# Patient Record
Sex: Female | Born: 1980 | Race: White | Hispanic: No | Marital: Married | Smoking: Never smoker
Health system: Southern US, Community
[De-identification: ages and names within clinical notes are randomized; demographics above are authoritative.]

## PROBLEM LIST (undated history)

## (undated) DIAGNOSIS — E890 Postprocedural hypothyroidism: Secondary | ICD-10-CM

## (undated) DIAGNOSIS — F32A Depression, unspecified: Secondary | ICD-10-CM

## (undated) DIAGNOSIS — F419 Anxiety disorder, unspecified: Secondary | ICD-10-CM

---

## 2002-07-21 HISTORY — PX: WISDOM TOOTH EXTRACTION: SHX21

## 2013-06-09 ENCOUNTER — Encounter: Payer: Self-pay | Admitting: *Deleted

## 2013-06-27 ENCOUNTER — Ambulatory Visit (INDEPENDENT_AMBULATORY_CARE_PROVIDER_SITE_OTHER): Payer: BC Managed Care – PPO | Admitting: General Surgery

## 2013-06-27 ENCOUNTER — Encounter: Payer: Self-pay | Admitting: General Surgery

## 2013-06-27 ENCOUNTER — Other Ambulatory Visit: Payer: BC Managed Care – PPO

## 2013-06-27 VITALS — BP 122/78 | HR 74 | Resp 12 | Ht 63.0 in | Wt 117.0 lb

## 2013-06-27 DIAGNOSIS — E079 Disorder of thyroid, unspecified: Secondary | ICD-10-CM | POA: Insufficient documentation

## 2013-06-27 NOTE — Progress Notes (Signed)
Patient ID: Matricia Cannon, female   DOB: [birth year], 32 y.o.   MRN: [id number]  Chief Complaint  Patient presents with  . Other    cervial lymphadenopathy    HPI Latasha Cannon is a 32 y.o. female here today for a evaluation of cervial lymphadenopathy.Patient states she noticed a lump left side of her neck about two months ago. She states the lump is getting bigger but does not hurt. No difficulty with breathing or swallowing. HPI  History reviewed. No pertinent past medical history.  Past Surgical History  Procedure Laterality Date  . Wisdom tooth extraction  2004    History reviewed. No pertinent family history.  Social History History  Substance Use Topics  . Smoking status: Never Smoker   . Smokeless tobacco: Never Used  . Alcohol Use: No    Allergies  Allergen Reactions  . Biaxin [Clarithromycin]     Vision problems   . Amoxicillin Rash  . Ceclor [Cefaclor] Rash  . Codeine Rash    Current Outpatient Prescriptions  Medication Sig Dispense Refill  . cholecalciferol (VITAMIN D) 1000 UNITS tablet Take 1,000 Units by mouth daily.      Marland Kitchen loratadine (CLARITIN) 10 MG tablet Take 10 mg by mouth daily.      Marland Kitchen MICROGESTIN 1-20 MG-MCG tablet Take 1 tablet by mouth daily.      . naproxen sodium (ANAPROX) 220 MG tablet Take 220 mg by mouth as needed.       No current facility-administered medications for this visit.    Review of Systems Review of Systems  Constitutional: Negative.   Respiratory: Negative.     Blood pressure 122/78, pulse 74, resp. rate 12, height 5\' 3"  (1.6 m), weight 117 lb (53.071 kg), last menstrual period 06/09/2013.  Physical Exam Physical Exam  Constitutional: She is oriented to person, place, and time. She appears well-developed and well-nourished.  Eyes: No scleral icterus.  Neck: Mass ( left lobe thryoid upper pole 1cm mass palpable  It is soft, moves with swallowing) present.    Cardiovascular: Normal rate, regular rhythm and normal heart  sounds.   Pulmonary/Chest: Breath sounds normal.  Lymphadenopathy:    She has no cervical adenopathy.    She has no axillary adenopathy.  Neurological: She is alert and oriented to person, place, and time.  Skin: Skin is warm and dry.    Data Reviewed   Assessment   Performed left thyroid ultrasound. There is a 2cm long, 1.3 cm wide solid mas near upper pole of left thyroid lobe.  Will check T4,TSH ;levels and plan for FNA. Pt advised fully and agreeable with the plan     Plan   Patient to have TSH and T4 labs done at [facility] . Patient to return for a FNA        Latasha Cannon G 06/27/2013, 2:36 PM

## 2013-06-28 LAB — T4: T4, Total: 11.6 ug/dL (ref 4.5–12.0)

## 2013-06-28 LAB — TSH: TSH: 2.95 u[IU]/mL (ref 0.450–4.500)

## 2013-06-29 ENCOUNTER — Encounter: Payer: Self-pay | Admitting: *Deleted

## 2013-06-29 ENCOUNTER — Ambulatory Visit (INDEPENDENT_AMBULATORY_CARE_PROVIDER_SITE_OTHER): Payer: BC Managed Care – PPO | Admitting: General Surgery

## 2013-06-29 ENCOUNTER — Other Ambulatory Visit: Payer: BC Managed Care – PPO

## 2013-06-29 ENCOUNTER — Encounter: Payer: Self-pay | Admitting: General Surgery

## 2013-06-29 VITALS — BP 140/80 | HR 82 | Resp 14 | Ht 63.0 in | Wt 116.0 lb

## 2013-06-29 DIAGNOSIS — E079 Disorder of thyroid, unspecified: Secondary | ICD-10-CM

## 2013-06-29 NOTE — Patient Instructions (Signed)
Keep area dry and clean.

## 2013-06-29 NOTE — Progress Notes (Signed)
Needle aspiration thyroid nodule done today, cytology sent. Labs-T4 11.6, TSH 2.90

## 2013-06-29 NOTE — Addendum Note (Signed)
Addended by: Volney Presser on: 06/29/2013 04:35 PM   Modules accepted: Orders

## 2013-07-01 LAB — FINE-NEEDLE ASPIRATION

## 2013-07-05 ENCOUNTER — Telehealth: Payer: Self-pay | Admitting: *Deleted

## 2013-07-05 NOTE — Telephone Encounter (Signed)
Patient has been scheduled for office visit follow for 09-07-13. She is aware of all instructions and verbalizes understanding.

## 2013-07-05 NOTE — Telephone Encounter (Signed)
Message copied by Nicholes Mango on Tue Jul 05, 2013  9:21 AM ------      Message from: Kieth Brightly      Created: Tue Jul 05, 2013  9:05 AM       Pt advised of report. Feel it is ok to observe for now. Need to recheck in 2 mos with exam and [place].      Pt advised to call if she has any pain, trouble swallowing or other symptoms in the interval. ------

## 2013-07-21 DIAGNOSIS — C801 Malignant (primary) neoplasm, unspecified: Secondary | ICD-10-CM

## 2013-07-21 HISTORY — PX: THYROIDECTOMY: SHX17

## 2013-07-21 HISTORY — DX: Malignant (primary) neoplasm, unspecified: C80.1

## 2013-08-15 ENCOUNTER — Ambulatory Visit: Payer: Self-pay | Admitting: Otolaryngology

## 2013-08-15 LAB — ALBUMIN: Albumin: 3.8 g/dL (ref 3.4–5.0)

## 2013-08-15 LAB — CALCIUM
CALCIUM: 8.7 mg/dL (ref 8.5–10.1)
CALCIUM: 9.1 mg/dL (ref 8.5–10.1)

## 2013-08-18 LAB — PATHOLOGY REPORT

## 2013-09-07 ENCOUNTER — Ambulatory Visit: Payer: BC Managed Care – PPO | Admitting: General Surgery

## 2013-09-20 ENCOUNTER — Ambulatory Visit: Payer: Self-pay

## 2014-05-22 ENCOUNTER — Encounter: Payer: Self-pay | Admitting: General Surgery

## 2014-07-05 DIAGNOSIS — K1120 Sialoadenitis, unspecified: Secondary | ICD-10-CM | POA: Insufficient documentation

## 2014-10-02 ENCOUNTER — Ambulatory Visit: Payer: Self-pay | Admitting: Internal Medicine

## 2014-11-11 NOTE — Op Note (Signed)
PATIENT NAME:  Latasha Cannon, Latasha Cannon MR#:  [id number] DATE OF BIRTH:  01-18-1981  DATE OF PROCEDURE:  08/15/2013  PREOPERATIVE DIAGNOSIS: Papillary thyroid carcinoma.   POSTOPERATIVE DIAGNOSIS: Papillary thyroid carcinoma.  PROCEDURE PERFORMED: Total thyroidectomy with laryngeal nerve monitoring.   SURGEON: Carloyn Manner, MD.  ANESTHESIA: General endotracheal anesthesia.   ESTIMATED BLOOD LOSS: Less than 25 mL.   IV FLUIDS: Please see anesthesia record.   COMPLICATIONS: None.   DRAINS/STENT PLACEMENTS: None.   SPECIMENS: Total thyroid marked superior pole of the left thyroid lobe.   ASSISTANT: Beverly Gust, MD.  INDICATIONS FOR PROCEDURE: The patient is a 34 year old female with history of a left thyroid nodule found on FNA to be consistent with papillary thyroid carcinoma. A neck ultrasound did not reveal any pathologic-appearing lymphadenopathy.   OPERATIVE FINDINGS: Bilateral recurrent laryngeal nerves were identified and preserved. Bilateral superior and inferior parathyroid glands were identified and preserved. There was a tumor with fibrotic scarring to the laryngeal musculature that was peeled off of the tracheal wall.   DESCRIPTION OF PROCEDURE: The patient was identified in holding, benefits and risks of the procedure were discussed and the patient was marked. The patient was taken to the operating  room, prepped and draped in a sterile fashion after insertion of a laryngeal nerve monitoring endotracheal tube. Visualization was ensured that the electrodes were at the level of the vocal cords. At this time, 3.5 mL of 0.25% plain Marcaine with 1:200,000 epinephrine were injected into the anterior neck and A 15 blade scalpel was used to make an incision along the previously marked anterior neck crease approximately 2 fingerbreadths above the sternal notch. Dissection was carried down through the level of the platysma with Bovie electrocautery. The strap muscles were encountered.  These were divided along the median raphe. At this time, the sternohyoid and sternothyroid muscles were noted to be fibrotically scarred down to the isthmus and the left superior pole, most likely from prior FNAs. At this point, the sternohyoid and sternothyroid muscles were separated from the left thyroid and care was taken to avoid entering the thyroid parenchyma itself. The lateral border of the thyroid and the great vessels were identified and then blunt dissection was carried superiorly and inferiorly. Superiorly, the superior pole was isolated medially and then laterally and this was pedicled and then the superior pole was taken with Bovie electrocautery with a Harmonic scalpel.   At this time, the left hemi-thyroid was retracted medially and using Terris elevators and retractors, the recurrent laryngeal nerve was identified. The superior and inferior parathyroid were also identified at this time and these were protected and left within the neck. The recurrent laryngeal nerve was traced until its insertion into the patient's larynx and then the remaining attachments of the left hemi-thyroid were divided from the trachea. There was fibrotic scarring at the midline of the isthmus and near the area of the nodule. This was peeled off the trachea with a Harmonic scalpel and bipolar forceps. At this time, a pyramidal lobe was encountered [place] of the isthmus. This was again fibrotically scarring to the underlying musculature. This was traced until its uperior most aspect and this was divided using a Harmonic scalpel. The remaining attachments of the isthmus were separated from the anterior tracheal wall. At this time, attention was directed to the patient's right hemi-thyroid and the sternothyroid and sternohyoid muscles were separated from the anterior border of the right hemi-thyroid using blunt techniques. Dissection was carried inferiorly and superiorly along the lateral border of  the thyroid until the carotid  was encountered. A plane between the larynx and the superior thyroid lobe was opened and the right superior thyroid lobe was pedicled and this was divided using a Harmonic scalpel. At this time, the hemi-thyroid was retracted medially. The superior parathyroid gland as well as an inferior parathyroid gland were identified and these were left down and not manipulated. Dissection just lateral to the trachea revealed the recurrent laryngeal nerve. This was robustly stimulated. The nerve was traced until its insertion in the larynx and then Berry's ligament was divided.   The remaining attachments along the inferior pole of the right hemi-thyroid were divided and then the entire specimen was passed off the table and a stitch was placed in the left superior pole. At this time, evaluation of the patient's thyroid wound bed revealed meticulous hemostasis. The wound was copiously irrigated with sterile saline. Meticulous hemostasis was ensured. All 4 parathyroid glands were identified as well as the recurrent laryngeal nerves were stimulated. Surgicel was placed along the thyroid wound bed, along Berry's ligament. The straps were closed in a single interrupted stitch of 4-0 Vicryl in a figure-of-eight fashion and the skin was reapproximated with subdermal Vicryl and the skin was closed with Dermabond and topped with a Steri-Strip. At this time, care of the patient was transferred to anesthesia.    ____________________________ Jerene Bears, MD ccv:aw D: 08/15/2013 09:38:20 ET T: 08/15/2013 10:17:24 ET JOB#: [id number]  cc: Jerene Bears, MD, <Dictator> Jerene Bears MD ELECTRONICALLY SIGNED 08/15/2013 17:40

## 2015-01-31 DIAGNOSIS — D229 Melanocytic nevi, unspecified: Secondary | ICD-10-CM

## 2015-01-31 HISTORY — DX: Melanocytic nevi, unspecified: D22.9

## 2015-02-21 DIAGNOSIS — F4320 Adjustment disorder, unspecified: Secondary | ICD-10-CM | POA: Insufficient documentation

## 2015-02-21 DIAGNOSIS — J309 Allergic rhinitis, unspecified: Secondary | ICD-10-CM | POA: Insufficient documentation

## 2015-02-22 ENCOUNTER — Ambulatory Visit (INDEPENDENT_AMBULATORY_CARE_PROVIDER_SITE_OTHER): Payer: BC Managed Care – PPO | Admitting: Family Medicine

## 2015-02-22 ENCOUNTER — Encounter: Payer: Self-pay | Admitting: Family Medicine

## 2015-02-22 VITALS — BP 112/78 | HR 98 | Temp 98.4°F | Resp 16 | Wt 117.0 lb

## 2015-02-22 DIAGNOSIS — C73 Malignant neoplasm of thyroid gland: Secondary | ICD-10-CM | POA: Diagnosis not present

## 2015-02-22 DIAGNOSIS — F419 Anxiety disorder, unspecified: Secondary | ICD-10-CM

## 2015-02-22 DIAGNOSIS — N941 Unspecified dyspareunia: Secondary | ICD-10-CM

## 2015-02-22 DIAGNOSIS — Z23 Encounter for immunization: Secondary | ICD-10-CM

## 2015-02-22 MED ORDER — ESCITALOPRAM OXALATE 5 MG PO TABS: 5.0000 mg | ORAL_TABLET | Freq: Every day | ORAL | Status: DC

## 2015-02-22 MED ORDER — ALPRAZOLAM 0.5 MG PO TBDP: 0.5000 mg | ORAL_TABLET | Freq: Two times a day (BID) | ORAL | Status: DC | PRN

## 2015-02-22 NOTE — Progress Notes (Deleted)
   Subjective:    Patient ID: Latasha Cannon, female    DOB: 1981-03-06, 34 y.o.   MRN: [id number]  Anxiety        Review of Systems     Objective:   Physical Exam        Assessment & Plan:

## 2015-02-22 NOTE — Progress Notes (Signed)
Patient ID: Latasha Cannon, female   DOB: 12-25-1980, 34 y.o.   MRN: [id number]    Subjective:  HPI Pt is here today to update Dr. Venia Cannon on what she has been being treated for. She reports that she had Stage 3 Papillary Thyroid carcinoma. In Jan 2015 they did a thyroidectomy and in March she had Radioactive Iodine Treatment. She reports that she had a scan done recently and she is cancer free, however the tumor marker was elevated. She also would like to discuss getting on another anxiety medication. She was taking Buspirone but it made her have tremors. She also says that she is sure she is long over due for a Tetanus vaccine and would like to get that today if possible.  Still having problems with dyspareunia. Is working with vaginal dilators and helping.    Prior to Admission medications   Medication Sig Start Date End Date Taking? Authorizing Provider  Cholecalciferol (VITAMIN D-1000 MAX ST) 1000 UNITS tablet Take by mouth.   Yes Historical Provider, MD  levothyroxine (SYNTHROID, LEVOTHROID) 88 MCG tablet Take by mouth. 07/04/14  Yes Historical Provider, MD  loratadine (CLARITIN) 10 MG tablet Take 10 mg by mouth.   Yes Historical Provider, MD  MICROGESTIN 1-20 MG-MCG tablet Take 1 tablet by mouth daily. 05/08/13  Yes Historical Provider, MD  naproxen sodium (ANAPROX) 220 MG tablet Take 220 mg by mouth as needed.   Yes Historical Provider, MD    Patient Active Problem List   Diagnosis Date Noted  . Papillary thyroid carcinoma 02/22/2015  . Anxiety 02/22/2015  . Allergic rhinitis 02/21/2015  . 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 Adenitis, salivary, recurring 07/05/2014  . Thyroid mass 06/27/2013  . Lump in thyroid 06/27/2013    History reviewed. No pertinent past medical history.  History   Social History  . Marital Status: Married    Spouse Name: N/A  . Number of Children: N/A  . Years of Education: College   Occupational History  . Not on file.   Social History Main Topics   . Smoking status: Never Smoker   . Smokeless tobacco: Never Used  . Alcohol Use: No  . Drug Use: No  . Sexual Activity: Not on file   Other Topics Concern  . Not on file   Social History Narrative    Allergies  Allergen Reactions  . Biaxin [Clarithromycin]     Vision problems   . Buspirone Other (See Comments)    tremors  . Prednisone   . Amoxicillin Rash  . Ceclor [Cefaclor] Rash  . Codeine Rash    Review of Systems  Constitutional: Negative.   HENT: Negative.   Eyes: Negative.   Respiratory: Negative.   Cardiovascular: Negative.   Gastrointestinal: Negative.   Genitourinary: Negative.   Musculoskeletal: Negative.   Skin: Negative.   Neurological: Negative.   Endo/Heme/Allergies: Negative.   Psychiatric/Behavioral: The patient is nervous/anxious.      There is no immunization history on file for this patient. Objective:  BP 112/78 mmHg  Pulse 98  Temp(Src) 98.4 F (36.9 C) (Oral)  Resp 16  Wt 117 lb (53.071 kg)  Physical Exam  Constitutional: She is oriented to person, place, and time and well-developed, well-nourished, and in no distress.  Neurological: She is oriented to person, place, and time.  Psychiatric: Mood, memory, affect and judgment normal.       Lab Results  Component Value Date   TSH 2.950 06/27/2013    CMP  Component Value Date/Time   CALCIUM 9.1 08/15/2013 1603   ALBUMIN 3.8 08/15/2013 1005    Assessment and Plan :  1. Papillary thyroid carcinoma In remission.  Is followed by endocrinologist, Dr. Gabriel Cannon.  Taking Levothyroxine.   2. Anxiety Worsening.  Will start very low dose Lexapro 2.5 mg daily. Recheck in 6 weeks.  Very sensitive to medication.    3. Need for diphtheria-tetanus-pertussis (Tdap) vaccine, adult/adolescent Given today.  - Tdap vaccine greater than or equal to 7yo IM  4. Dyspareunia, female Continue to work with dilators with hopes of intercourse. Does want to have children.     [facility] 02/22/2015 11:14 AM

## 2015-03-05 ENCOUNTER — Encounter: Payer: Self-pay | Admitting: Family Medicine

## 2015-03-09 ENCOUNTER — Telehealth: Payer: Self-pay | Admitting: Family Medicine

## 2015-03-09 NOTE — Telephone Encounter (Signed)
Patient called at 3:30 pm reporting that she has noticed that the pupil in her left eye looks dilated. Patient wanted to know if Lexapro could be causing her pupil to dilate. Patient reports that she started taking medication 2 weeks ago. Patient denies redness, discharge or any injury to her eye. Patient reports that she does have problems with seasonal allergies. Patient reports that she has not tried any OTC medication to help with her symptoms. I advised pt to be seen today, pt reports that she could not come in today. I advised pt to call or go to ER if symptoms persist or worsen over night or over the weekend. Patient's call back number is [phone]. sd

## 2015-03-13 ENCOUNTER — Ambulatory Visit (INDEPENDENT_AMBULATORY_CARE_PROVIDER_SITE_OTHER): Payer: BC Managed Care – PPO | Admitting: Family Medicine

## 2015-03-13 ENCOUNTER — Encounter: Payer: Self-pay | Admitting: Family Medicine

## 2015-03-13 VITALS — BP 114/84 | HR 72 | Temp 98.2°F | Resp 14 | Ht 64.0 in | Wt 117.2 lb

## 2015-03-13 DIAGNOSIS — J302 Other seasonal allergic rhinitis: Secondary | ICD-10-CM | POA: Diagnosis not present

## 2015-03-13 DIAGNOSIS — H5702 Anisocoria: Secondary | ICD-10-CM

## 2015-03-13 MED ORDER — FLUTICASONE PROPIONATE 50 MCG/ACT NA SUSP: 2.0000 | Freq: Every day | NASAL | Status: DC

## 2015-03-13 NOTE — Patient Instructions (Signed)
Please f/u with eye doctor.

## 2015-03-13 NOTE — Progress Notes (Signed)
Subjective:     Patient ID: Latasha Cannon, female   DOB: 10/13/1980, 34 y.o.   MRN: [id number]  HPI  Chief Complaint  Patient presents with  . Eye Problem    Patient comes in office today with concerns of dilation of her left eye. Patient states that peers and coworkers have noticed change in left eye and she believes it might be due to new medication she started two weeks ago called Escitalopram. Patient states that when she read side effects on medication it stated that it can cause eye problems swelling and itching. Patient states that she does have a history of seasonal allergies and is unsure if it could be related or not to symptoms.   She states she and her co-workers noticed her left pupil was larger than her right about a week ago. No change in vision reported but she has developed increased allergy sx in the last few days with itchy watery eyes. This has not improved with the use of Zaditor for the last two days. Hx of papillary thyroid cancer with surgery and RAI treatments a year ago.   Review of Systems  HENT:       Denies eye injury or prior hx of anisocoria       Objective:   Physical Exam  Constitutional: She appears well-developed and well-nourished. No distress.  Left pupil slightly larger than right. Both reactive in ambient light and in the dark. EOM's intact     Assessment:    Anisocoria - Plan: Ambulatory referral to Ophthalmology, CANCELED: Ambulatory referral to Ophthalmology  Other seasonal allergic rhinitis - Plan: fluticasone (FLONASE) 50 MCG/ACT nasal spray     Plan:    Further f/u pending ophthalmology evaluation.

## 2015-03-14 ENCOUNTER — Telehealth: Payer: Self-pay | Admitting: Family Medicine

## 2015-03-14 NOTE — Telephone Encounter (Signed)
Pt states she seen Mikki Santee and was referred to [facility].  The doctor at [facility] has told pt that the Rx escitalopram (LEXAPRO) 5 MG could be the cause of her eye issues.  Pt is asking can she just stop taking this Rx or does she need to slowly stop taking this Rx to see if this will help with the eye issue?  [id number]#[phone]/TD

## 2015-03-14 NOTE — Telephone Encounter (Signed)
Ok to stop.  Will cut in 1/2 for couple of days and stop.  Will take as needed medication and call me back. Thanks.

## 2015-04-04 ENCOUNTER — Ambulatory Visit (INDEPENDENT_AMBULATORY_CARE_PROVIDER_SITE_OTHER): Payer: BC Managed Care – PPO | Admitting: Family Medicine

## 2015-04-04 ENCOUNTER — Encounter: Payer: Self-pay | Admitting: Family Medicine

## 2015-04-04 VITALS — BP 116/74 | HR 96 | Temp 99.0°F | Resp 16 | Wt 119.0 lb

## 2015-04-04 DIAGNOSIS — J302 Other seasonal allergic rhinitis: Secondary | ICD-10-CM

## 2015-04-04 DIAGNOSIS — F419 Anxiety disorder, unspecified: Secondary | ICD-10-CM

## 2015-04-04 DIAGNOSIS — N941 Unspecified dyspareunia: Secondary | ICD-10-CM

## 2015-04-04 DIAGNOSIS — H6980 Other specified disorders of Eustachian tube, unspecified ear: Secondary | ICD-10-CM | POA: Diagnosis not present

## 2015-04-04 NOTE — Progress Notes (Signed)
Subjective:    Patient ID: Latasha Cannon, female    DOB: 28-May-1981, 34 y.o.   MRN: [id number]  Anxiety Presents for follow-up visit. Symptoms include depressed mood, excessive worry, irritability, nervous/anxious behavior and palpitations. Patient reports no chest pain, compulsions, confusion, decreased concentration, dizziness, dry mouth, feeling of choking, hyperventilation, insomnia, malaise, muscle tension, nausea, obsessions, panic, restlessness, shortness of breath or suicidal ideas. The severity of symptoms is mild. The quality of sleep is good.   Prior compliance problems include medication issues (Pt could not take Escitalopram due to eye problems.).  Otalgia  There is pain in both ears. The current episode started 1 to 4 weeks ago. The problem has been waxing and waning. There has been no fever. The pain is at a severity of 3/10. The pain is mild. Associated symptoms include headaches (sinus). Pertinent negatives include no abdominal pain, coughing, diarrhea, ear discharge, hearing loss, neck pain, rash, rhinorrhea, sore throat or vomiting. She has tried NSAIDs for the symptoms. The treatment provided moderate relief. There is no history of a chronic ear infection, hearing loss or a tympanostomy tube.   Has been taking nasal spray. Not helping.     Review of Systems  Constitutional: Positive for irritability.  HENT: Positive for ear pain. Negative for ear discharge, hearing loss, rhinorrhea and sore throat.   Respiratory: Negative for cough and shortness of breath.   Cardiovascular: Positive for palpitations. Negative for chest pain.  Gastrointestinal: Negative for nausea, vomiting, abdominal pain and diarrhea.  Musculoskeletal: Negative for neck pain.  Skin: Negative for rash.  Neurological: Positive for headaches (sinus). Negative for dizziness.  Psychiatric/Behavioral: Negative for suicidal ideas, confusion and decreased concentration. The patient is nervous/anxious. The  patient does not have insomnia.    BP 116/74 mmHg  Pulse 96  Temp(Src) 99 F (37.2 C) (Oral)  Resp 16  Wt 119 lb (53.978 kg)  LMP 03/21/2015 (Within Days)   Patient Active Problem List   Diagnosis Date Noted  . Papillary thyroid carcinoma 02/22/2015  . Anxiety 02/22/2015  . Dyspareunia, female 02/22/2015  . Allergic rhinitis 02/21/2015  . 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 Adenitis, salivary, recurring 07/05/2014  . Thyroid mass 06/27/2013   No past medical history on file. Current Outpatient Prescriptions on File Prior to Visit  Medication Sig  . ALPRAZolam (NIRAVAM) 0.5 MG dissolvable tablet Take 1 tablet (0.5 mg total) by mouth 2 (two) times daily as needed for anxiety.  . Cholecalciferol (VITAMIN D-1000 MAX ST) 1000 UNITS tablet Take by mouth.  . fluticasone (FLONASE) 50 MCG/ACT nasal spray Place 2 sprays into both nostrils daily.  Marland Kitchen levothyroxine (SYNTHROID, LEVOTHROID) 88 MCG tablet Take by mouth.  . loratadine (CLARITIN) 10 MG tablet Take 10 mg by mouth.  Marland Kitchen MICROGESTIN 1-20 MG-MCG tablet Take 1 tablet by mouth daily.  . naproxen sodium (ANAPROX) 220 MG tablet Take 220 mg by mouth as needed.   No current facility-administered medications on file prior to visit.   Allergies  Allergen Reactions  . Biaxin [Clarithromycin]     Vision problems   . Buspirone Other (See Comments)    tremors  . Escitalopram     Eye Problems  . Prednisone   . Amoxicillin Rash  . Ceclor [Cefaclor] Rash  . Codeine Rash   Past Surgical History  Procedure Laterality Date  . Wisdom tooth extraction  2004  . Thyroidectomy  07/2013    due to thyroid cancer   Social History   Social History  .  Marital Status: Married    Spouse Name: N/A  . Number of Children: N/A  . Years of Education: College   Occupational History  . Not on file.   Social History Main Topics  . Smoking status: Never Smoker   . Smokeless tobacco: Never Used  . Alcohol Use: No  . Drug Use: No  . Sexual  Activity: Not on file   Other Topics Concern  . Not on file   Social History Narrative   Family History  Problem Relation Age of Onset  . Anxiety disorder Mother   . Stroke Father late 44's  . Healthy Brother        Objective:   Physical Exam  Constitutional: She is oriented to person, place, and time. She appears well-developed and well-nourished.  HENT:  Head: Normocephalic and atraumatic.  Right Ear: External ear normal.  Left Ear: External ear normal.  Mouth/Throat: Oropharynx is clear and moist.  Eyes: Conjunctivae and EOM are normal. Pupils are equal, round, and reactive to light.  Neck: Normal range of motion. Neck supple.  Cardiovascular: Normal rate and regular rhythm.   Pulmonary/Chest: Effort normal and breath sounds normal.  Neurological: She is alert and oriented to person, place, and time.  Psychiatric: She has a normal mood and affect. Her behavior is normal. Judgment and thought content normal.   BP 116/74 mmHg  Pulse 96  Temp(Src) 99 F (37.2 C) (Oral)  Resp 16  Wt 119 lb (53.978 kg)  LMP 03/21/2015 (Within Days)         Assessment & Plan:  1. Eustachian tube dysfunction, unspecified laterality Start using  Flonase daily. If does not improve, will refer to ENT.   2. Other seasonal allergic rhinitis Stable.   3. Anxiety Did not tolerate Lexapro, caused eye issues. Continue Alprazolam as needed. Consider Sertraline if worsens. Patient will call.    4. Dyspareunia, female Continue with dilators. Use Xanax and see if helps.   Patient was seen and examined by Jerrell Belfast, MD, and note scribed, in part,  by Renaldo Fiddler, CMA and reviewed with patient. I have reviewed the document for accuracy and completeness and I agree with above. Jerrell Belfast, MD   Margarita Rana, MD

## 2015-05-02 ENCOUNTER — Other Ambulatory Visit: Payer: Self-pay | Admitting: Internal Medicine

## 2015-05-02 DIAGNOSIS — C73 Malignant neoplasm of thyroid gland: Secondary | ICD-10-CM

## 2015-05-28 ENCOUNTER — Encounter: Payer: Self-pay | Admitting: Family Medicine

## 2015-05-28 ENCOUNTER — Ambulatory Visit (INDEPENDENT_AMBULATORY_CARE_PROVIDER_SITE_OTHER): Payer: BC Managed Care – PPO | Admitting: Family Medicine

## 2015-05-28 VITALS — BP 138/90 | HR 105 | Temp 98.0°F | Resp 16 | Ht 64.0 in | Wt 124.6 lb

## 2015-05-28 DIAGNOSIS — J069 Acute upper respiratory infection, unspecified: Secondary | ICD-10-CM | POA: Diagnosis not present

## 2015-05-28 NOTE — Progress Notes (Signed)
Subjective:     Patient ID: Latasha Cannon, female   DOB: 10-Feb-1981, 34 y.o.   MRN: [id number]  HPI Chief Complaint  Patient presents with  . Cough    Patient comes in office today with concerns of cough and congestion x 1 week. Patient reports that symptoms began inital as a sore throat and post nasal drip. Today patient reports that she has watery eyes and her voice is going hoarse, patient states that she has taken otc cold medication.   Reports drainage is clear.  Review of Systems  Constitutional: Negative for fever and chills.       Objective:   Physical Exam  Constitutional: She appears well-developed and well-nourished. No distress.  Ears: T.M's intact without inflammation Throat: no tonsillar enlargement or exudate Neck: no cervical adenopathy Lungs: clear     Assessment:    1. Upper respiratory infection    Plan:    discussed symptomatic treatment.

## 2015-05-28 NOTE — Patient Instructions (Addendum)
Discussed use of Sudafed PE for congestion, Delsym for cough, and Benadryl for postnasal drainage

## 2015-10-15 ENCOUNTER — Ambulatory Visit
Admission: RE | Admit: 2015-10-15 | Discharge: 2015-10-15 | Disposition: A | Discharge status: Home / Self Care | Payer: BC Managed Care – PPO | Source: Ambulatory Visit | Attending: Internal Medicine | Admitting: Internal Medicine

## 2015-10-15 ENCOUNTER — Ambulatory Visit: Admission: RE | Admit: 2015-10-15 | Payer: BC Managed Care – PPO | Source: Ambulatory Visit

## 2015-10-15 DIAGNOSIS — C73 Malignant neoplasm of thyroid gland: Secondary | ICD-10-CM | POA: Diagnosis not present

## 2015-10-15 MED ORDER — THYROTROPIN ALFA 1.1 MG IM SOLR
0.9000 mg | INTRAMUSCULAR | Status: AC
  Administered 2015-10-15: 0.9 mg via INTRAMUSCULAR
  Filled 2015-10-15: qty 0.9

## 2015-10-16 ENCOUNTER — Ambulatory Visit
Admission: RE | Admit: 2015-10-16 | Discharge: 2015-10-16 | Disposition: A | Discharge status: Home / Self Care | Payer: BC Managed Care – PPO | Source: Ambulatory Visit | Attending: Internal Medicine | Admitting: Internal Medicine

## 2015-10-16 ENCOUNTER — Ambulatory Visit: Payer: Self-pay

## 2015-10-16 DIAGNOSIS — C73 Malignant neoplasm of thyroid gland: Secondary | ICD-10-CM | POA: Insufficient documentation

## 2015-10-16 MED ORDER — THYROTROPIN ALFA 1.1 MG IM SOLR
0.9000 mg | INTRAMUSCULAR | Status: DC
Start: 2015-10-16 — End: 2015-10-17
  Administered 2015-10-17: 0.9 mg via INTRAMUSCULAR
  Filled 2015-10-16: qty 0.9

## 2015-10-17 ENCOUNTER — Ambulatory Visit: Payer: Self-pay

## 2015-10-17 DIAGNOSIS — C73 Malignant neoplasm of thyroid gland: Secondary | ICD-10-CM | POA: Diagnosis not present

## 2016-01-11 ENCOUNTER — Encounter: Payer: Self-pay | Admitting: Family Medicine

## 2016-01-11 ENCOUNTER — Ambulatory Visit (INDEPENDENT_AMBULATORY_CARE_PROVIDER_SITE_OTHER): Payer: BC Managed Care – PPO | Admitting: Family Medicine

## 2016-01-11 ENCOUNTER — Other Ambulatory Visit: Payer: Self-pay

## 2016-01-11 VITALS — BP 112/64 | HR 100 | Temp 98.2°F | Resp 16 | Ht 65.0 in | Wt 123.0 lb

## 2016-01-11 DIAGNOSIS — F419 Anxiety disorder, unspecified: Secondary | ICD-10-CM

## 2016-01-11 DIAGNOSIS — R002 Palpitations: Secondary | ICD-10-CM

## 2016-01-11 DIAGNOSIS — N941 Unspecified dyspareunia: Secondary | ICD-10-CM | POA: Diagnosis not present

## 2016-01-11 DIAGNOSIS — E559 Vitamin D deficiency, unspecified: Secondary | ICD-10-CM | POA: Diagnosis not present

## 2016-01-11 DIAGNOSIS — C73 Malignant neoplasm of thyroid gland: Secondary | ICD-10-CM

## 2016-01-11 DIAGNOSIS — Z0000 Encounter for general adult medical examination without abnormal findings: Secondary | ICD-10-CM | POA: Diagnosis not present

## 2016-01-11 DIAGNOSIS — N920 Excessive and frequent menstruation with regular cycle: Secondary | ICD-10-CM | POA: Diagnosis not present

## 2016-01-11 DIAGNOSIS — Z126 Encounter for screening for malignant neoplasm of bladder: Secondary | ICD-10-CM | POA: Diagnosis not present

## 2016-01-11 DIAGNOSIS — R000 Tachycardia, unspecified: Secondary | ICD-10-CM

## 2016-01-11 LAB — POCT URINALYSIS DIPSTICK
Bilirubin, UA: NEGATIVE
Blood, UA: NEGATIVE
GLUCOSE UA: NEGATIVE
KETONES UA: NEGATIVE
Leukocytes, UA: NEGATIVE
Nitrite, UA: NEGATIVE
SPEC GRAV UA: 1.01
Urobilinogen, UA: 0.2
pH, UA: 7.5

## 2016-01-11 MED ORDER — ALPRAZOLAM 0.5 MG PO TBDP: 0.5000 mg | ORAL_TABLET | Freq: Two times a day (BID) | ORAL | Status: DC | PRN

## 2016-01-11 MED ORDER — CENTRUM PO CHEW: 1.0000 | CHEWABLE_TABLET | Freq: Every day | ORAL | Status: DC

## 2016-01-11 MED ORDER — NORETHINDRONE ACET-ETHINYL EST 1-20 MG-MCG PO TABS
1.0000 | ORAL_TABLET | Freq: Every day | ORAL | Status: DC
Start: 2016-01-11 — End: 2016-01-14

## 2016-01-11 NOTE — Patient Instructions (Signed)
Start Multivitamin that has at least 400 mcg.

## 2016-01-11 NOTE — Progress Notes (Signed)
Patient: Latasha Cannon, Female    DOB: 03-24-1981, 35 y.o.   MRN: [id number] Visit Date: 01/11/2016  Today's Provider: Margarita Rana, MD   Chief Complaint  Patient presents with  . Annual Exam  . Anxiety   Subjective:    Annual physical exam Latasha Cannon is a 35 y.o. female who presents today for health maintenance and complete physical. She feels well. She reports exercising rarely. She reports she is sleeping well.  Last Pap was June 2016 with GYN and was negative.  ----------------------------------------------------------------- Anxiety: Patient complains of anxiety disorder.  She has the following symptoms: irritable, palpitations, sweating. Onset of symptoms was approximately several years ago, unchanged since that time. She denies current suicidal and homicidal ideation. Family history significant for anxiety. Previous treatment includes Xanax. She complains of the following side effects from the treatment: none. Pt requesting refill on Alprazolam.  GAD 7 : Generalized Anxiety Score 01/11/2016  Nervous, Anxious, on Edge 1  Control/stop worrying 0  Worry too much - different things 1  Trouble relaxing 3  Restless 0  Easily annoyed or irritable 0  Afraid - awful might happen 2  Total GAD 7 Score 7  Anxiety Difficulty Somewhat difficult     Review of Systems  Cardiovascular: Positive for palpitations.  Gastrointestinal: Positive for constipation (pt's baseline).  Neurological: Positive for headaches.  Psychiatric/Behavioral: The patient is nervous/anxious.   All other systems reviewed and are negative.   Social History      She  reports that she has never smoked. She has never used smokeless tobacco. She reports that she does not drink alcohol or use illicit drugs.       Social History   Social History  . Marital Status: Married    Spouse Name: [name]  . Number of Children: 0  . Years of Education: College   Social History Main Topics  . Smoking  status: Never Smoker   . Smokeless tobacco: Never Used  . Alcohol Use: No  . Drug Use: No  . Sexual Activity: Not Asked   Other Topics Concern  . None   Social History Narrative    History reviewed. No pertinent past medical history.   Patient Active Problem List   Diagnosis Date Noted  . Annual physical exam 01/11/2016  . Palpitations 01/11/2016  . Bladder cancer screening 01/11/2016  . Tachycardia 01/11/2016  . Papillary thyroid carcinoma ([place]) 02/22/2015  . Anxiety 02/22/2015  . Dyspareunia, female 02/22/2015  . Allergic rhinitis 02/21/2015  . 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 Adenitis, salivary, recurring 07/05/2014  . Thyroid mass 06/27/2013    Past Surgical History  Procedure Laterality Date  . Wisdom tooth extraction  2004  . Thyroidectomy  07/2013    due to thyroid cancer    Family History        Family Status  Relation Status Death Age  . Mother Alive   . Father Alive   . Brother Alive         Her family history includes Anxiety disorder in her mother; Healthy in her brother; Stroke (age of onset: late 92's) in her father.    Allergies  Allergen Reactions  . Biaxin [Clarithromycin]     Vision problems   . Buspirone Other (See Comments)    tremors  . Escitalopram     Eye Problems  . Prednisone   . Amoxicillin Rash  . Ceclor [Cefaclor] Rash  . Codeine Rash    Current  Meds  Medication Sig  . ALPRAZolam (NIRAVAM) 0.5 MG dissolvable tablet Take 1 tablet (0.5 mg total) by mouth 2 (two) times daily as needed for anxiety.  . Cholecalciferol (VITAMIN D-1000 MAX ST) 1000 UNITS tablet Take by mouth.  . fluticasone (FLONASE) 50 MCG/ACT nasal spray Place 2 sprays into both nostrils daily.  Marland Kitchen levothyroxine (SYNTHROID, LEVOTHROID) 88 MCG tablet Take by mouth.  . loratadine (CLARITIN) 10 MG tablet Take 10 mg by mouth.  Marland Kitchen MICROGESTIN 1-20 MG-MCG tablet Take 1 tablet by mouth daily.  . naproxen sodium (ANAPROX) 220 MG tablet Take 220 mg by mouth as needed.   . Thyrotropin Alfa (THYROGEN IM)   . [DISCONTINUED] ALPRAZolam (NIRAVAM) 0.5 MG dissolvable tablet Take 0.5 mg by mouth 2 (two) times daily as needed for anxiety.    Patient Care Team: Margarita Rana, MD as PCP - General (Family Medicine) Birdie Sons, MD as Referring Physician (Family Medicine) Christene Lye, MD (General Surgery)     Objective:   Vitals: BP 112/64 mmHg  Pulse 100  Temp(Src) 98.2 F (36.8 C) (Oral)  Resp 16  Ht '5\' 5"'  (1.651 m)  Wt 123 lb (55.792 kg)  BMI 20.47 kg/m2  LMP 12/21/2015   Physical Exam  Constitutional: She is oriented to person, place, and time. She appears well-developed and well-nourished.  HENT:  Head: Normocephalic and atraumatic.  Right Ear: Tympanic membrane, external ear and ear canal normal.  Left Ear: Tympanic membrane, external ear and ear canal normal.  Nose: Nose normal.  Mouth/Throat: Uvula is midline, oropharynx is clear and moist and mucous membranes are normal.  Eyes: Conjunctivae, EOM and lids are normal. Pupils are equal, round, and reactive to light.  Neck: Trachea normal and normal range of motion. Neck supple. Carotid bruit is not present. No thyroid mass and no thyromegaly present.  Cardiovascular: Normal rate, regular rhythm and normal heart sounds.   Pulmonary/Chest: Effort normal and breath sounds normal.  Abdominal: Soft. Normal appearance and bowel sounds are normal. There is no hepatosplenomegaly. There is no tenderness.  Genitourinary: No breast swelling, tenderness or discharge.  Musculoskeletal: Normal range of motion.  Lymphadenopathy:    She has no cervical adenopathy.    She has no axillary adenopathy.  Neurological: She is alert and oriented to person, place, and time. She has normal strength. No cranial nerve deficit.  Skin: Skin is warm, dry and intact.  Psychiatric: She has a normal mood and affect. Her speech is normal and behavior is normal. Judgment and thought content normal. Cognition and  memory are normal.     Depression Screen PHQ 2/9 Scores 01/11/2016  PHQ - 2 Score 1      Assessment & Plan:     Routine Health Maintenance and Physical Exam  Exercise Activities and Dietary recommendations Goals    None      Immunization History  Administered Date(s) Administered  . MMR 06/20/1996  . Tdap 02/22/2015  . Varicella 06/20/1996, 07/25/1996    Health Maintenance  Topic Date Due  . HIV Screening  06/08/1995  . PAP SMEAR  06/07/2001  . INFLUENZA VACCINE  02/19/2016  . TETANUS/TDAP  02/21/2025      Discussed health benefits of physical activity, and encouraged her to engage in regular exercise appropriate for her age and condition.    -------------------------------------------------------------------- 1. Annual physical exam Stable. Start multivitamin with folic acid, as there is a possibility pt will become pregnant. - multivitamin-iron-minerals-folic acid (CENTRUM) chewable tablet; Chew 1 tablet by mouth  daily.  Dispense: 30 tablet; Refill: 0  2. Palpitations Most likely secondary to anxiety. EKG WNL.  - EKG 12-Lead  3. Bladder cancer screening UA negative. Results for orders placed or performed in visit on 01/11/16  POCT urinalysis dipstick  Result Value Ref Range   Color, UA yellow    Clarity, UA clear    Glucose, UA negative    Bilirubin, UA negative    Ketones, UA negative    Spec Grav, UA 1.010    Blood, UA negative    pH, UA 7.5    Protein, UA trace    Urobilinogen, UA 0.2    Nitrite, UA negative    Leukocytes, UA Negative Negative   - POCT urinalysis dipstick  4. Anxiety Stable. Refills provided. - ALPRAZolam (NIRAVAM) 0.5 MG dissolvable tablet; Take 1 tablet (0.5 mg total) by mouth 2 (two) times daily as needed for anxiety.  Dispense: 30 tablet; Refill: 5  5. Tachycardia Has seen cardiology in the past for this. EKG WNL. Will check labs to R/O anemia. - CBC with Differential/Platelet - Comprehensive metabolic panel  6.  Menorrhagia with regular cycle Refills provided. Pt may decide to D/C OCP. May improve dyspareunia. Refills provided incase menorrhagia worsens off of medication. - norethindrone-ethinyl estradiol (MICROGESTIN) 1-20 MG-MCG tablet; Take 1 tablet by mouth daily.  Dispense: 1 Package; Refill: 11  7. Avitaminosis D FU pending lab results. - VITAMIN D 25 Hydroxy (Vit-D Deficiency, Fractures)  8. Dyspareunia, female Improving but not to goal. Pt using dilators, with relief. Still unable to have sex. Consider D/C OCP, which may further help to improve this problem.  9. Papillary thyroid carcinoma ([place]) F/B endocrinology.    Patient seen and examined by Jerrell Belfast, MD, and note scribed by Renaldo Fiddler, CMA.   I have reviewed the document for accuracy and completeness and I agree with above. - Jerrell Belfast, MD   Margarita Rana, MD  [facility]

## 2016-01-14 ENCOUNTER — Other Ambulatory Visit: Payer: Self-pay | Admitting: Family Medicine

## 2016-01-14 DIAGNOSIS — N920 Excessive and frequent menstruation with regular cycle: Secondary | ICD-10-CM

## 2016-01-14 MED ORDER — NORETHINDRONE ACET-ETHINYL EST 1-20 MG-MCG PO TABS: 1.0000 | ORAL_TABLET | Freq: Every day | ORAL | Status: DC

## 2016-01-14 NOTE — Telephone Encounter (Signed)
Pt states the pharmacy ([place])  Didn't get the refill of norethindrone-ethinyl estradiol (MICROGESTIN) 1-20 MG-MCG tablet that was to be sent on Friday.  PT is requesting it be sent today.

## 2016-01-15 LAB — CBC WITH DIFFERENTIAL/PLATELET
Basophils Absolute: 0 10*3/uL (ref 0.0–0.2)
Basos: 1 %
EOS (ABSOLUTE): 0.1 10*3/uL (ref 0.0–0.4)
EOS: 1 %
Hematocrit: 40.8 % (ref 34.0–46.6)
Hemoglobin: 13.9 g/dL (ref 11.1–15.9)
IMMATURE GRANULOCYTES: 0 %
Immature Grans (Abs): 0 10*3/uL (ref 0.0–0.1)
Lymphocytes Absolute: 1.2 10*3/uL (ref 0.7–3.1)
Lymphs: 34 %
MCH: 30 pg (ref 26.6–33.0)
MCHC: 34.1 g/dL (ref 31.5–35.7)
MCV: 88 fL (ref 79–97)
MONOS ABS: 0.2 10*3/uL (ref 0.1–0.9)
Monocytes: 5 %
NEUTROS PCT: 59 %
Neutrophils Absolute: 2.1 10*3/uL (ref 1.4–7.0)
PLATELETS: 269 10*3/uL (ref 150–379)
RBC: 4.64 x10E6/uL (ref 3.77–5.28)
RDW: 13.1 % (ref 12.3–15.4)
WBC: 3.6 10*3/uL (ref 3.4–10.8)

## 2016-01-15 LAB — COMPREHENSIVE METABOLIC PANEL
ALBUMIN: 4.6 g/dL (ref 3.5–5.5)
ALT: 20 IU/L (ref 0–32)
AST: 14 IU/L (ref 0–40)
Albumin/Globulin Ratio: 1.5 (ref 1.2–2.2)
Alkaline Phosphatase: 41 IU/L (ref 39–117)
BUN / CREAT RATIO: 15 (ref 9–23)
BUN: 10 mg/dL (ref 6–20)
CALCIUM: 9.5 mg/dL (ref 8.7–10.2)
CO2: 21 mmol/L (ref 18–29)
CREATININE: 0.68 mg/dL (ref 0.57–1.00)
Chloride: 104 mmol/L (ref 96–106)
GFR, EST AFRICAN AMERICAN: 131 mL/min/{1.73_m2} (ref >59)
GFR, EST NON AFRICAN AMERICAN: 114 mL/min/{1.73_m2} (ref >59)
GLUCOSE: 97 mg/dL (ref 65–99)
Globulin, Total: 3 g/dL (ref 1.5–4.5)
Potassium: 4.3 mmol/L (ref 3.5–5.2)
Sodium: 140 mmol/L (ref 134–144)
TOTAL PROTEIN: 7.6 g/dL (ref 6.0–8.5)

## 2016-01-15 LAB — VITAMIN D 25 HYDROXY (VIT D DEFICIENCY, FRACTURES): VIT D 25 HYDROXY: 34.9 ng/mL (ref 30.0–100.0)

## 2016-01-30 ENCOUNTER — Encounter: Payer: BC Managed Care – PPO | Admitting: Family Medicine

## 2017-01-27 LAB — HEPATIC FUNCTION PANEL
ALT: 24 (ref 7–35)
AST: 16 (ref 13–35)
Alkaline Phosphatase: 45 (ref 25–125)
BILIRUBIN, TOTAL: 0.2

## 2017-01-27 LAB — CBC AND DIFFERENTIAL
HCT: 40 (ref 36–46)
HEMOGLOBIN: 13.6 (ref 12.0–16.0)
Neutrophils Absolute: 3
PLATELETS: 286 (ref 150–399)
WBC: 5.1

## 2017-01-27 LAB — LIPID PANEL
Cholesterol: 164 (ref 0–200)
HDL: 49 (ref 35–70)
LDL CALC: 94
Triglycerides: 103 (ref 40–160)

## 2017-01-27 LAB — BASIC METABOLIC PANEL
BUN: 9 (ref 4–21)
Creatinine: 0.7 (ref 0.5–1.1)
GLUCOSE: 97
POTASSIUM: 4.7 (ref 3.4–5.3)
SODIUM: 139 (ref 137–147)

## 2017-01-27 LAB — VITAMIN D 25 HYDROXY (VIT D DEFICIENCY, FRACTURES): Vit D, 25-Hydroxy: 31.4

## 2017-11-25 LAB — TSH: TSH: 0.54 (ref 0.41–5.90)

## 2017-12-22 ENCOUNTER — Encounter: Payer: Self-pay | Admitting: Family Medicine

## 2018-02-11 ENCOUNTER — Encounter: Payer: BC Managed Care – PPO | Admitting: Family Medicine

## 2018-02-24 ENCOUNTER — Encounter: Payer: Self-pay | Admitting: Family Medicine

## 2018-02-24 ENCOUNTER — Other Ambulatory Visit (HOSPITAL_COMMUNITY)
Admission: RE | Admit: 2018-02-24 | Discharge: 2018-02-24 | Disposition: A | Discharge status: Home / Self Care | Payer: BC Managed Care – PPO | Source: Ambulatory Visit | Attending: Family Medicine | Admitting: Family Medicine

## 2018-02-24 ENCOUNTER — Ambulatory Visit (INDEPENDENT_AMBULATORY_CARE_PROVIDER_SITE_OTHER): Payer: BC Managed Care – PPO | Admitting: Family Medicine

## 2018-02-24 VITALS — BP 122/90 | HR 120 | Temp 98.3°F | Resp 16 | Ht 64.0 in | Wt 129.0 lb

## 2018-02-24 DIAGNOSIS — F419 Anxiety disorder, unspecified: Secondary | ICD-10-CM | POA: Diagnosis not present

## 2018-02-24 DIAGNOSIS — Z0000 Encounter for general adult medical examination without abnormal findings: Secondary | ICD-10-CM

## 2018-02-24 DIAGNOSIS — Z124 Encounter for screening for malignant neoplasm of cervix: Secondary | ICD-10-CM

## 2018-02-24 DIAGNOSIS — N920 Excessive and frequent menstruation with regular cycle: Secondary | ICD-10-CM

## 2018-02-24 DIAGNOSIS — Z803 Family history of malignant neoplasm of breast: Secondary | ICD-10-CM | POA: Insufficient documentation

## 2018-02-24 DIAGNOSIS — E559 Vitamin D deficiency, unspecified: Secondary | ICD-10-CM | POA: Diagnosis not present

## 2018-02-24 DIAGNOSIS — C73 Malignant neoplasm of thyroid gland: Secondary | ICD-10-CM

## 2018-02-24 MED ORDER — NORETHINDRONE ACET-ETHINYL EST 1-20 MG-MCG PO TABS: 1.0000 | ORAL_TABLET | Freq: Every day | ORAL | 3 refills | Status: DC

## 2018-02-24 MED ORDER — NORETHINDRONE ACET-ETHINYL EST 1-20 MG-MCG PO TABS: 1.0000 | ORAL_TABLET | Freq: Every day | ORAL | 11 refills | Status: DC

## 2018-02-24 MED ORDER — CITALOPRAM HYDROBROMIDE 20 MG PO TABS: 20.0000 mg | ORAL_TABLET | Freq: Every day | ORAL | 3 refills | Status: DC

## 2018-02-24 NOTE — Progress Notes (Signed)
Patient: Latasha Cannon, Female    DOB: [birth year], 37 y.o.   MRN: [id number] Visit Date: 02/24/2018  Today's Provider: Lavon Paganini, MD   I, Martha Clan, CMA, am acting as scribe for Lavon Paganini, MD.  Chief Complaint  Patient presents with  . Annual Exam   Subjective:    Annual physical exam Latasha Cannon is a 37 y.o. female who presents today for health maintenance and complete physical. She feels fairly well. She states she is nervous. She was on Niravam in the past, and would like to discuss restarting this. She reports exercising irregularly; she does walk some. She reports she is sleeping fairly well.  Lats pap- 2016 through GYN. Normal per pt. [facility]). She would like to update this today. (Pt mentions she has a H/O vaginismus.) Pt is concerned because her mother was just diagnosed with breast cancer at age 89.  Her MGM also had breast cancer.  Mother was genetic tested and was negative. Pt is wondering if she needs breast cancer screening before age 19.  Reports significant anxiety with some depressive symptoms for many years.  She was started on Celexa 77m daily and noticed no difference.  She was increased to 117mdaily ~3 months ago and continues to be symptomatic.  Reports good compliance and denies any side effects from the medication.  She was previously on Xanax prn and has been out for ~1 yr. -----------------------------------------------------------------   Review of Systems  Constitutional: Positive for appetite change. Negative for activity change, chills, diaphoresis, fatigue, fever and unexpected weight change.  HENT: Negative.   Eyes: Positive for itching. Negative for photophobia, pain, discharge, redness and visual disturbance.  Respiratory: Negative.   Cardiovascular: Negative.   Gastrointestinal: Negative.   Endocrine: Positive for polyphagia. Negative for cold intolerance, heat intolerance, polydipsia and polyuria.    Genitourinary: Negative.   Musculoskeletal: Negative.   Skin: Negative.   Allergic/Immunologic: Positive for environmental allergies. Negative for food allergies and immunocompromised state.  Neurological: Positive for headaches. Negative for dizziness, tremors, seizures, syncope, facial asymmetry, speech difficulty, weakness, light-headedness and numbness.  Hematological: Negative.   Psychiatric/Behavioral: Positive for agitation. Negative for behavioral problems, confusion, decreased concentration, dysphoric mood, hallucinations, self-injury, sleep disturbance and suicidal ideas. The patient is nervous/anxious. The patient is not hyperactive.     Social History      She  reports that she has never smoked. She has never used smokeless tobacco. She reports that she does not drink alcohol or use drugs.       Social History   Socioeconomic History  . Marital status: Married    Spouse name: JoKloie Whiting. Number of children: 0  . Years of education: 16  . Highest education level: Bachelor's degree (e.g., BA, AB, BS)  Occupational History  . Not on file  Social Needs  . Financial resource strain: Not on file  . Food insecurity:    Worry: Not on file    Inability: Not on file  . Transportation needs:    Medical: Not on file    Non-medical: Not on file  Tobacco Use  . Smoking status: Never Smoker  . Smokeless tobacco: Never Used  Substance and Sexual Activity  . Alcohol use: No  . Drug use: No  . Sexual activity: Not on file  Lifestyle  . Physical activity:    Days per week: Not on file    Minutes per session: Not on file  .  Stress: Not on file  Relationships  . Social connections:    Talks on phone: Not on file    Gets together: Not on file    Attends religious service: Not on file    Active member of club or organization: Not on file    Attends meetings of clubs or organizations: Not on file    Relationship status: Not on file  Other Topics Concern  . Not on file   Social History Narrative  . Not on file    History reviewed. No pertinent past medical history.   Patient Active Problem List   Diagnosis Date Noted  . Palpitations 01/11/2016  . Bladder cancer screening 01/11/2016  . Tachycardia 01/11/2016  . Menorrhagia 01/11/2016  . Papillary thyroid carcinoma ([place]) 02/22/2015  . Anxiety 02/22/2015  . Dyspareunia, female 02/22/2015  . Allergic rhinitis 02/21/2015  . 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 Adenitis, salivary, recurring 07/05/2014  . Thyroid mass 06/27/2013    Past Surgical History:  Procedure Laterality Date  . THYROIDECTOMY  07/2013   due to thyroid cancer  . [place] EXTRACTION  2004    Family History        Family Status  Relation Name Status  . Mother  Alive  . Father  Alive  . Brother  Alive  . MGM  (Not Specified)  . Neg Hx  (Not Specified)        Her family history includes Anxiety disorder in her mother; Breast cancer in her maternal grandmother and mother; Healthy in her brother; Stroke (age of onset: late 74's) in her father; Uterine cancer in her maternal grandmother. There is no history of Colon cancer, Ovarian cancer, or Cervical cancer.      Allergies  Allergen Reactions  . Biaxin [Clarithromycin]     Vision problems   . Buspirone Other (See Comments)    tremors  . Escitalopram     Eye Problems  . Prednisone   . Amoxicillin Rash  . Ceclor [Cefaclor] Rash  . Codeine Rash     Current Outpatient Medications:  .  citalopram (CELEXA) 10 MG tablet, Take 1 tablet by mouth daily., Disp: , Rfl:  .  levothyroxine (SYNTHROID, LEVOTHROID) 88 MCG tablet, Take by mouth., Disp: , Rfl:  .  loratadine (CLARITIN) 10 MG tablet, Take 10 mg by mouth., Disp: , Rfl:  .  naproxen sodium (ANAPROX) 220 MG tablet, Take 220 mg by mouth as needed., Disp: , Rfl:  .  norethindrone-ethinyl estradiol (MICROGESTIN) 1-20 MG-MCG tablet, Take 1 tablet by mouth daily., Disp: 1 Package, Rfl: 11 .  ALPRAZolam (NIRAVAM) 0.5 MG  dissolvable tablet, Take 1 tablet (0.5 mg total) by mouth 2 (two) times daily as needed for anxiety. (Patient not taking: Reported on 02/24/2018), Disp: 30 tablet, Rfl: 5 .  Cholecalciferol (VITAMIN D-1000 MAX ST) 1000 UNITS tablet, Take by mouth., Disp: , Rfl:  .  fluticasone (FLONASE) 50 MCG/ACT nasal spray, Place 2 sprays into both nostrils daily. (Patient not taking: Reported on 02/24/2018), Disp: 16 g, Rfl: 1   Patient Care Team: Virginia Crews, MD as PCP - General (Family Medicine) Caryn Section Kirstie Peri, MD as Referring Physician (Family Medicine) Christene Lye, MD (General Surgery)      Objective:   Vitals: BP 122/90 (BP Location: Left Arm, Patient Position: Sitting, Cuff Size: Normal)   Pulse (!) 120   Temp 98.3 F (36.8 C) (Oral)   Resp 16   Ht '5\' 4"'  (1.626 m)   Wt 129  lb (58.5 kg)   LMP 02/10/2018   SpO2 99%   BMI 22.14 kg/m    Vitals:   02/24/18 1505  BP: 122/90  Pulse: (!) 120  Resp: 16  Temp: 98.3 F (36.8 C)  TempSrc: Oral  SpO2: 99%  Weight: 129 lb (58.5 kg)  Height: '5\' 4"'  (1.626 m)     Physical Exam  Constitutional: She is oriented to person, place, and time. She appears well-developed and well-nourished. No distress.  HENT:  Head: Normocephalic and atraumatic.  Right Ear: External ear normal.  Left Ear: External ear normal.  Nose: Nose normal.  Mouth/Throat: Oropharynx is clear and moist.  Eyes: Pupils are equal, round, and reactive to light. Conjunctivae and EOM are normal. No scleral icterus.  Neck: Neck supple. No thyromegaly present.  Cardiovascular: Normal rate, regular rhythm, normal heart sounds and intact distal pulses.  No murmur heard. Pulmonary/Chest: Effort normal and breath sounds normal. No respiratory distress. She has no wheezes. She has no rales.  Abdominal: Soft. Bowel sounds are normal. She exhibits no distension. There is no tenderness. There is no rebound and no guarding.  Genitourinary:  Genitourinary Comments: GYN:   External genitalia within normal limits.  +vaginismus. Vaginal mucosa pink, moist, normal rugae.  Nonfriable cervix without lesions, no discharge or bleeding noted on speculum exam.  Breasts: breasts appear normal, no suspicious masses, no skin or nipple changes or axillary nodes.   Musculoskeletal: She exhibits no edema or deformity.  Lymphadenopathy:    She has no cervical adenopathy.  Neurological: She is alert and oriented to person, place, and time.  Skin: Skin is warm and dry. Capillary refill takes less than 2 seconds. No rash noted.  Psychiatric: She has a normal mood and affect. Her behavior is normal.  Vitals reviewed.    Depression Screen PHQ 2/9 Scores 02/24/2018 01/11/2016  PHQ - 2 Score 2 1  PHQ- 9 Score 4 -      Assessment & Plan:     Routine Health Maintenance and Physical Exam  Exercise Activities and Dietary recommendations Goals    None      Immunization History  Administered Date(s) Administered  . MMR 06/20/1996  . Tdap 02/22/2015  . Varicella 06/20/1996, 07/25/1996    Health Maintenance  Topic Date Due  . HIV Screening  06/08/1995  . PAP SMEAR  06/07/2001  . INFLUENZA VACCINE  02/18/2018  . TETANUS/TDAP  02/21/2025     Discussed health benefits of physical activity, and encouraged her to engage in regular exercise appropriate for her age and condition.    --------------------------------------------------------------------  Problem List Items Addressed This Visit      Endocrine   Papillary thyroid carcinoma ([place])    Followed q59mby Endocrinology        Other   Anxiety    Uncontrolled Chronic Increase celexa to 262mdaily Would avoid benzos F/u in 3 months      Relevant Medications   citalopram (CELEXA) 20 MG tablet   Menorrhagia    Continue OCPs      Relevant Medications   norethindrone-ethinyl estradiol (MICROGESTIN) 1-20 MG-MCG tablet   Other Relevant Orders   CBC   Avitaminosis D   Relevant Orders   VITAMIN D 25  Hydroxy (Vit-D Deficiency, Fractures)   Family history of breast cancer    Given that mother is gene negative for hereditary breast cancer, no genetic testing indicated Will starrt screening mammograms at age 37>10 yrs prior to mother's diagnosis)  Other Visit Diagnoses    Encounter for annual physical exam    -  Primary   Relevant Orders   Lipid panel   Comprehensive metabolic panel   CBC   VITAMIN D 25 Hydroxy (Vit-D Deficiency, Fractures)   Cervical cancer screening       Relevant Orders   Cytology - PAP      Return in about 3 months (around 05/27/2018) for anxiety f/u.   The entirety of the information documented in the History of Present Illness, Review of Systems and Physical Exam were personally obtained by me. Portions of this information were initially documented by Raquel Sarna Ratchford, CMA and reviewed by me for thoroughness and accuracy.    Virginia Crews, MD, MPH [facility] 02/24/2018 3:53 PM

## 2018-02-24 NOTE — Patient Instructions (Signed)
Preventive Care 18-39 Years, Female Preventive care refers to lifestyle choices and visits with your health care provider that can promote health and wellness. What does preventive care include?  A yearly physical exam. This is also called an annual well check.  Dental exams once or twice a year.  Routine eye exams. Ask your health care provider how often you should have your eyes checked.  Personal lifestyle choices, including: ? Daily care of your teeth and gums. ? Regular physical activity. ? Eating a healthy diet. ? Avoiding tobacco and drug use. ? Limiting alcohol use. ? Practicing safe sex. ? Taking vitamin and mineral supplements as recommended by your health care provider. What happens during an annual well check? The services and screenings done by your health care provider during your annual well check will depend on your age, overall health, lifestyle risk factors, and family history of disease. Counseling Your health care provider may ask you questions about your:  Alcohol use.  Tobacco use.  Drug use.  Emotional well-being.  Home and relationship well-being.  Sexual activity.  Eating habits.  Work and work Statistician.  Method of birth control.  Menstrual cycle.  Pregnancy history.  Screening You may have the following tests or measurements:  Height, weight, and BMI.  Diabetes screening. This is done by checking your blood sugar (glucose) after you have not eaten for a while (fasting).  Blood pressure.  Lipid and cholesterol levels. These may be checked every 5 years starting at age 66.  Skin check.  Hepatitis C blood test.  Hepatitis B blood test.  Sexually transmitted disease (STD) testing.  BRCA-related cancer screening. This may be done if you have a family history of breast, ovarian, tubal, or peritoneal cancers.  Pelvic exam and Pap test. This may be done every 3 years starting at age 40. Starting at age 59, this may be done every 5  years if you have a Pap test in combination with an HPV test.  Discuss your test results, treatment options, and if necessary, the need for more tests with your health care provider. Vaccines Your health care provider may recommend certain vaccines, such as:  Influenza vaccine. This is recommended every year.  Tetanus, diphtheria, and acellular pertussis (Tdap, Td) vaccine. You may need a Td booster every 10 years.  Varicella vaccine. You may need this if you have not been vaccinated.  HPV vaccine. If you are 69 or younger, you may need three doses over 6 months.  Measles, mumps, and rubella (MMR) vaccine. You may need at least one dose of MMR. You may also need a second dose.  Pneumococcal 13-valent conjugate (PCV13) vaccine. You may need this if you have certain conditions and were not previously vaccinated.  Pneumococcal polysaccharide (PPSV23) vaccine. You may need one or two doses if you smoke cigarettes or if you have certain conditions.  Meningococcal vaccine. One dose is recommended if you are age 27-21 years and a first-year college student living in a residence hall, or if you have one of several medical conditions. You may also need additional booster doses.  Hepatitis A vaccine. You may need this if you have certain conditions or if you travel or work in places where you may be exposed to hepatitis A.  Hepatitis B vaccine. You may need this if you have certain conditions or if you travel or work in places where you may be exposed to hepatitis B.  Haemophilus influenzae type b (Hib) vaccine. You may need this if  you have certain risk factors.  Talk to your health care provider about which screenings and vaccines you need and how often you need them. This information is not intended to replace advice given to you by your health care provider. Make sure you discuss any questions you have with your health care provider. Document Released: 09/02/2001 Document Revised: 03/26/2016  Document Reviewed: 05/08/2015 Elsevier Interactive Patient Education  [employer].

## 2018-02-24 NOTE — Assessment & Plan Note (Signed)
Uncontrolled Chronic Increase celexa to 20mg  daily Would avoid benzos F/u in 3 months

## 2018-02-24 NOTE — Assessment & Plan Note (Signed)
Followed q35m by Endocrinology

## 2018-02-24 NOTE — Assessment & Plan Note (Signed)
Continue OCPs

## 2018-02-24 NOTE — Assessment & Plan Note (Signed)
Given that mother is gene negative for hereditary breast cancer, no genetic testing indicated Will starrt screening mammograms at age 37 (>10 yrs prior to mother's diagnosis)

## 2018-02-26 ENCOUNTER — Telehealth: Payer: Self-pay

## 2018-02-26 LAB — VITAMIN D 25 HYDROXY (VIT D DEFICIENCY, FRACTURES): VIT D 25 HYDROXY: 25.7 ng/mL — AB (ref 30.0–100.0)

## 2018-02-26 LAB — COMPREHENSIVE METABOLIC PANEL
A/G RATIO: 1.8 (ref 1.2–2.2)
ALT: 26 IU/L (ref 0–32)
AST: 17 IU/L (ref 0–40)
Albumin: 4.4 g/dL (ref 3.5–5.5)
Alkaline Phosphatase: 42 IU/L (ref 39–117)
BILIRUBIN TOTAL: 0.3 mg/dL (ref 0.0–1.2)
BUN/Creatinine Ratio: 13 (ref 9–23)
BUN: 9 mg/dL (ref 6–20)
CALCIUM: 9.1 mg/dL (ref 8.7–10.2)
CHLORIDE: 102 mmol/L (ref 96–106)
CO2: 20 mmol/L (ref 20–29)
Creatinine, Ser: 0.71 mg/dL (ref 0.57–1.00)
GFR calc non Af Amer: 109 mL/min/{1.73_m2} (ref >59)
GFR, EST AFRICAN AMERICAN: 126 mL/min/{1.73_m2} (ref >59)
GLOBULIN, TOTAL: 2.5 g/dL (ref 1.5–4.5)
Glucose: 86 mg/dL (ref 65–99)
POTASSIUM: 4.1 mmol/L (ref 3.5–5.2)
SODIUM: 135 mmol/L (ref 134–144)
TOTAL PROTEIN: 6.9 g/dL (ref 6.0–8.5)

## 2018-02-26 LAB — CBC
Hematocrit: 41 % (ref 34.0–46.6)
Hemoglobin: 13.2 g/dL (ref 11.1–15.9)
MCH: 29.1 pg (ref 26.6–33.0)
MCHC: 32.2 g/dL (ref 31.5–35.7)
MCV: 91 fL (ref 79–97)
PLATELETS: 298 10*3/uL (ref 150–450)
RBC: 4.53 x10E6/uL (ref 3.77–5.28)
RDW: 13 % (ref 12.3–15.4)
WBC: 5.9 10*3/uL (ref 3.4–10.8)

## 2018-02-26 LAB — LIPID PANEL
CHOL/HDL RATIO: 3.6 ratio (ref 0.0–4.4)
CHOLESTEROL TOTAL: 169 mg/dL (ref 100–199)
HDL: 47 mg/dL (ref >39)
LDL CALC: 95 mg/dL (ref 0–99)
TRIGLYCERIDES: 133 mg/dL (ref 0–149)
VLDL Cholesterol Cal: 27 mg/dL (ref 5–40)

## 2018-02-26 LAB — CYTOLOGY - PAP
Diagnosis: NEGATIVE
HPV: NOT DETECTED

## 2018-02-26 NOTE — Telephone Encounter (Signed)
Pt advised and agrees with plan. 

## 2018-02-26 NOTE — Telephone Encounter (Signed)
Left message advising pt. OK per DPR. 

## 2018-02-26 NOTE — Telephone Encounter (Signed)
-----   Message from Virginia Crews, MD sent at 02/26/2018  3:15 PM EDT ----- Normal Pap smear.  HPV negative.  Repeat in 5 years.  Virginia Crews, MD, MPH [facility] 02/26/2018 3:15 PM

## 2018-02-26 NOTE — Telephone Encounter (Signed)
-----   Message from Virginia Crews, MD sent at 02/26/2018  8:10 AM EDT ----- Normal choelsterol, Blood counts, kidney function, liver function, electrolytes.  Vit D level is slightly low.  Would increase from 1000 to 2000 units daily of supplement.  Virginia Crews, MD, MPH [facility] 02/26/2018 8:10 AM

## 2018-03-02 ENCOUNTER — Encounter: Payer: Self-pay | Admitting: Family Medicine

## 2018-05-27 ENCOUNTER — Encounter: Payer: Self-pay | Admitting: Family Medicine

## 2018-05-27 ENCOUNTER — Ambulatory Visit: Payer: BC Managed Care – PPO | Admitting: Family Medicine

## 2018-05-27 VITALS — BP 112/76 | HR 90 | Temp 98.5°F | Wt 132.8 lb

## 2018-05-27 DIAGNOSIS — F419 Anxiety disorder, unspecified: Secondary | ICD-10-CM

## 2018-05-27 DIAGNOSIS — E559 Vitamin D deficiency, unspecified: Secondary | ICD-10-CM

## 2018-05-27 DIAGNOSIS — C73 Malignant neoplasm of thyroid gland: Secondary | ICD-10-CM | POA: Diagnosis not present

## 2018-05-27 NOTE — Assessment & Plan Note (Signed)
Recheck Vit D level Fatigue may be related to this if still low

## 2018-05-27 NOTE — Progress Notes (Signed)
Patient: Latasha Cannon Female    DOB: [birth year]   37 y.o.   MRN: [id number] Visit Date: 05/27/2018  Today's Provider: Lavon Paganini, MD   Chief Complaint  Patient presents with  . Anxiety   Subjective:    I, Tiburcio Pea, CMA, am acting as a scribe for Lavon Paganini, MD.   HPI Anxiety:  Patient presents for a 3 month follow up. Last OV was on 02/24/2018. Patient advised to increase Celexa to 20 mg daily, avoid Benzos, and follow up in 3 months. He reports good compliance with treatment plan. She states symptoms are improved.  Has not needed to take Xanax since last visit.    GAD 7 : Generalized Anxiety Score 05/27/2018 01/11/2016  Nervous, Anxious, on Edge 1 1  Control/stop worrying 1 0  Worry too much - different things 1 1  Trouble relaxing 1 3  Restless 1 0  Easily annoyed or irritable 1 0  Afraid - awful might happen 1 2  Total GAD 7 Score 7 7  Anxiety Difficulty Not difficult at all Somewhat difficult   Continues to feel fatigued.  Has increased Vit D to 2000 units daily. She is trying to walk more at work and eat healthier.  Thyroid is well controlled.   Allergies  Allergen Reactions  . Biaxin [Clarithromycin]     Vision problems   . Buspirone Other (See Comments)    tremors  . Escitalopram     Eye Problems  . Prednisone   . Amoxicillin Rash  . Ceclor [Cefaclor] Rash  . Codeine Rash     Current Outpatient Medications:  .  Cholecalciferol (VITAMIN D-1000 MAX ST) 1000 UNITS tablet, Take 2,000 Units by mouth daily., Disp: , Rfl:  .  citalopram (CELEXA) 20 MG tablet, Take 1 tablet (20 mg total) by mouth daily., Disp: 90 tablet, Rfl: 3 .  levothyroxine (SYNTHROID, LEVOTHROID) 88 MCG tablet, Take by mouth., Disp: , Rfl:  .  loratadine (CLARITIN) 10 MG tablet, Take 10 mg by mouth., Disp: , Rfl:  .  naproxen sodium (ANAPROX) 220 MG tablet, Take 220 mg by mouth as needed., Disp: , Rfl:  .  norethindrone-ethinyl estradiol (MICROGESTIN) 1-20 MG-MCG  tablet, Take 1 tablet by mouth daily., Disp: 3 Package, Rfl: 3 .  fluticasone (FLONASE) 50 MCG/ACT nasal spray, Place 2 sprays into both nostrils daily. (Patient not taking: Reported on 02/24/2018), Disp: 16 g, Rfl: 1  Review of Systems  Constitutional: Negative.   Respiratory: Negative.   Cardiovascular: Negative.   Psychiatric/Behavioral: The patient is nervous/anxious.     Social History   Tobacco Use  . Smoking status: Never Smoker  . Smokeless tobacco: Never Used  Substance Use Topics  . Alcohol use: No   Objective:   BP 112/76 (BP Location: Left Arm, Patient Position: Sitting, Cuff Size: Normal)   Pulse 90   Temp 98.5 F (36.9 C) (Oral)   Wt 132 lb 12.8 oz (60.2 kg)   SpO2 99%   BMI 22.80 kg/m  Vitals:   05/27/18 1543  BP: 112/76  Pulse: 90  Temp: 98.5 F (36.9 C)  TempSrc: Oral  SpO2: 99%  Weight: 132 lb 12.8 oz (60.2 kg)     Physical Exam      Assessment & Plan:   Problem List Items Addressed This Visit      Endocrine   Papillary thyroid carcinoma ([place])    Followed by Endocrinology Last TSH wnl Unlikely to be contributing to  fatigue        Other   Anxiety - Primary    Well controlled now on higher dose of Celexa Will continue Celexa 20mg  daily Avoid benzos F/u in 9 months at CPE or sooner as needed      Avitaminosis D    Recheck Vit D level Fatigue may be related to this if still low      Relevant Orders   VITAMIN D 25 Hydroxy (Vit-D Deficiency, Fractures)      Return in about 9 months (around 02/25/2019) for CPE.   The entirety of the information documented in the History of Present Illness, Review of Systems and Physical Exam were personally obtained by me. Portions of this information were initially documented by Tiburcio Pea, CMA and reviewed by me for thoroughness and accuracy.    Virginia Crews, MD, MPH [facility] 05/27/2018 4:06 PM

## 2018-05-27 NOTE — Assessment & Plan Note (Signed)
Followed by Endocrinology Last TSH wnl Unlikely to be contributing to fatigue

## 2018-05-27 NOTE — Assessment & Plan Note (Signed)
Well controlled now on higher dose of Celexa Will continue Celexa 20mg  daily Avoid benzos F/u in 9 months at CPE or sooner as needed

## 2018-05-28 ENCOUNTER — Telehealth: Payer: Self-pay

## 2018-05-28 LAB — VITAMIN D 25 HYDROXY (VIT D DEFICIENCY, FRACTURES): Vit D, 25-Hydroxy: 38.4 ng/mL (ref 30.0–100.0)

## 2018-05-28 NOTE — Telephone Encounter (Signed)
-----   Message from Virginia Crews, MD sent at 05/28/2018  8:18 AM EST ----- Vit D now normal.  Continue current supplement  Brita Romp Dionne Bucy, MD, MPH [facility] 05/28/2018 8:18 AM

## 2018-05-28 NOTE — Telephone Encounter (Signed)
Patient was advised.  

## 2018-12-08 LAB — TSH: TSH: 0.35 — AB (ref <=5.90)

## 2019-03-02 ENCOUNTER — Encounter: Payer: Self-pay | Admitting: Family Medicine

## 2019-03-02 ENCOUNTER — Other Ambulatory Visit: Payer: Self-pay

## 2019-03-02 ENCOUNTER — Ambulatory Visit (INDEPENDENT_AMBULATORY_CARE_PROVIDER_SITE_OTHER): Payer: BC Managed Care – PPO | Admitting: Family Medicine

## 2019-03-02 VITALS — BP 127/85 | HR 97 | Temp 98.1°F | Ht 64.0 in | Wt 144.6 lb

## 2019-03-02 DIAGNOSIS — Z0000 Encounter for general adult medical examination without abnormal findings: Secondary | ICD-10-CM | POA: Diagnosis not present

## 2019-03-02 DIAGNOSIS — C73 Malignant neoplasm of thyroid gland: Secondary | ICD-10-CM

## 2019-03-02 DIAGNOSIS — Z114 Encounter for screening for human immunodeficiency virus [HIV]: Secondary | ICD-10-CM

## 2019-03-02 DIAGNOSIS — E559 Vitamin D deficiency, unspecified: Secondary | ICD-10-CM

## 2019-03-02 DIAGNOSIS — N920 Excessive and frequent menstruation with regular cycle: Secondary | ICD-10-CM

## 2019-03-02 DIAGNOSIS — F419 Anxiety disorder, unspecified: Secondary | ICD-10-CM

## 2019-03-02 MED ORDER — CITALOPRAM HYDROBROMIDE 20 MG PO TABS: 20.0000 mg | ORAL_TABLET | Freq: Every day | ORAL | 3 refills | Status: DC

## 2019-03-02 MED ORDER — NORETHINDRONE ACET-ETHINYL EST 1-20 MG-MCG PO TABS: 1.0000 | ORAL_TABLET | Freq: Every day | ORAL | 3 refills | Status: DC

## 2019-03-02 NOTE — Assessment & Plan Note (Signed)
Well controlled Continue Celexa 20mg  daily

## 2019-03-02 NOTE — Assessment & Plan Note (Signed)
Followed by Endocrinology

## 2019-03-02 NOTE — Patient Instructions (Signed)
Preventive Care 21-39 Years Old, Female Preventive care refers to visits with your health care provider and lifestyle choices that can promote health and wellness. This includes:  A yearly physical exam. This may also be called an annual well check.  Regular dental visits and eye exams.  Immunizations.  Screening for certain conditions.  Healthy lifestyle choices, such as eating a healthy diet, getting regular exercise, not using drugs or products that contain nicotine and tobacco, and limiting alcohol use. What can I expect for my preventive care visit? Physical exam Your health care provider will check your:  Height and weight. This may be used to calculate body mass index (BMI), which tells if you are at a healthy weight.  Heart rate and blood pressure.  Skin for abnormal spots. Counseling Your health care provider may ask you questions about your:  Alcohol, tobacco, and drug use.  Emotional well-being.  Home and relationship well-being.  Sexual activity.  Eating habits.  Work and work environment.  Method of birth control.  Menstrual cycle.  Pregnancy history. What immunizations do I need?  Influenza (flu) vaccine  This is recommended every year. Tetanus, diphtheria, and pertussis (Tdap) vaccine  You may need a Td booster every 10 years. Varicella (chickenpox) vaccine  You may need this if you have not been vaccinated. Human papillomavirus (HPV) vaccine  If recommended by your health care provider, you may need three doses over 6 months. Measles, mumps, and rubella (MMR) vaccine  You may need at least one dose of MMR. You may also need a second dose. Meningococcal conjugate (MenACWY) vaccine  One dose is recommended if you are age 19-21 years and a first-year college student living in a residence hall, or if you have one of several medical conditions. You may also need additional booster doses. Pneumococcal conjugate (PCV13) vaccine  You may need  this if you have certain conditions and were not previously vaccinated. Pneumococcal polysaccharide (PPSV23) vaccine  You may need one or two doses if you smoke cigarettes or if you have certain conditions. Hepatitis A vaccine  You may need this if you have certain conditions or if you travel or work in places where you may be exposed to hepatitis A. Hepatitis B vaccine  You may need this if you have certain conditions or if you travel or work in places where you may be exposed to hepatitis B. Haemophilus influenzae type b (Hib) vaccine  You may need this if you have certain conditions. You may receive vaccines as individual doses or as more than one vaccine together in one shot (combination vaccines). Talk with your health care provider about the risks and benefits of combination vaccines. What tests do I need?  Blood tests  Lipid and cholesterol levels. These may be checked every 5 years starting at age 20.  Hepatitis C test.  Hepatitis B test. Screening  Diabetes screening. This is done by checking your blood sugar (glucose) after you have not eaten for a while (fasting).  Sexually transmitted disease (STD) testing.  BRCA-related cancer screening. This may be done if you have a family history of breast, ovarian, tubal, or peritoneal cancers.  Pelvic exam and Pap test. This may be done every 3 years starting at age 21. Starting at age 30, this may be done every 5 years if you have a Pap test in combination with an HPV test. Talk with your health care provider about your test results, treatment options, and if necessary, the need for more tests.   Follow these instructions at home: Eating and drinking   Eat a diet that includes fresh fruits and vegetables, whole grains, lean protein, and low-fat dairy.  Take vitamin and mineral supplements as recommended by your health care provider.  Do not drink alcohol if: ? Your health care provider tells you not to drink. ? You are  pregnant, may be pregnant, or are planning to become pregnant.  If you drink alcohol: ? Limit how much you have to 0-1 drink a day. ? Be aware of how much alcohol is in your drink. In the [place] one drink equals one 12 oz bottle of beer (355 mL), one 5 oz glass of wine (148 mL), or one 1 oz glass of hard liquor (44 mL). Lifestyle  Take daily care of your teeth and gums.  Stay active. Exercise for at least 30 minutes on 5 or more days each week.  Do not use any products that contain nicotine or tobacco, such as cigarettes, e-cigarettes, and chewing tobacco. If you need help quitting, ask your health care provider.  If you are sexually active, practice safe sex. Use a condom or other form of birth control (contraception) in order to prevent pregnancy and STIs (sexually transmitted infections). If you plan to become pregnant, see your health care provider for a preconception visit. What's next?  Visit your health care provider once a year for a well check visit.  Ask your health care provider how often you should have your eyes and teeth checked.  Stay up to date on all vaccines. This information is not intended to replace advice given to you by your health care provider. Make sure you discuss any questions you have with your health care provider. Document Released: 09/02/2001 Document Revised: 03/18/2018 Document Reviewed: 03/18/2018 Elsevier Patient Education  2020 Elsevier Inc.  

## 2019-03-02 NOTE — Progress Notes (Signed)
Patient: Latasha Cannon, Female    DOB: 1981-02-28, 38 y.o.   MRN: [id number] Visit Date: 03/02/2019  Today's Provider: Lavon Paganini, MD   Chief Complaint  Patient presents with  . Annual Exam   Subjective:    I, Tiburcio Pea, CMA, am acting as a scribe for Lavon Paganini, MD.    Annual physical exam Latasha Cannon is a 38 y.o. female who presents today for health maintenance and complete physical. She feels well. She reports exercising none. She reports she is sleeping well.  Recently weight gain. Unintentional.  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  Endocrinology adjusted Synthroid dose.  Periods are light since thyroidectomy.  She is having cramping before the cycle.  Taking OCPs with good compliance -----------------------------------------------------------------   Review of Systems  Constitutional: Negative.   HENT: Positive for sinus pressure and sneezing.   Eyes: Positive for itching.  Respiratory: Negative.   Cardiovascular: Negative.   Gastrointestinal: Positive for constipation.  Endocrine: Positive for heat intolerance.  Genitourinary: Negative.   Musculoskeletal: Negative.   Skin: Negative.   Allergic/Immunologic: Negative.   Neurological: Positive for headaches.  Hematological: Negative.   Psychiatric/Behavioral: The patient is nervous/anxious.     Social History      She  reports that she has never smoked. She has never used smokeless tobacco. She reports that she does not drink alcohol or use drugs.       Social History   Socioeconomic History  . Marital status: Married    Spouse name: Brynli Ollis  . Number of children: 0  . Years of education: 16  . Highest education level: Bachelor's degree (e.g., BA, AB, BS)  Occupational History  . Not on file  Social Needs  . Financial resource strain: Not on file  . Food insecurity    Worry: Not on file    Inability: Not on file  . Transportation needs   Medical: Not on file    Non-medical: Not on file  Tobacco Use  . Smoking status: Never Smoker  . Smokeless tobacco: Never Used  Substance and Sexual Activity  . Alcohol use: No  . Drug use: No  . Sexual activity: Not on file  Lifestyle  . Physical activity    Days per week: Not on file    Minutes per session: Not on file  . Stress: Not on file  Relationships  . Social Herbalist on phone: Not on file    Gets together: Not on file    Attends religious service: Not on file    Active member of club or organization: Not on file    Attends meetings of clubs or organizations: Not on file    Relationship status: Not on file  Other Topics Concern  . Not on file  Social History Narrative  . Not on file    History reviewed. No pertinent past medical history.   Patient Active Problem List   Diagnosis Date Noted  . Avitaminosis D 02/24/2018  . Family history of breast cancer 02/24/2018  . Palpitations 01/11/2016  . Tachycardia 01/11/2016  . Menorrhagia 01/11/2016  . Papillary thyroid carcinoma ([place]) 02/22/2015  . Anxiety 02/22/2015  . Dyspareunia, female 02/22/2015  . Allergic rhinitis 02/21/2015  . 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 Adenitis, salivary, recurring 07/05/2014  . Thyroid mass 06/27/2013    Past Surgical History:  Procedure Laterality Date  . THYROIDECTOMY  07/2013   due to  thyroid cancer  . [place] EXTRACTION  2004    Family History        Family Status  Relation Name Status  . Mother  Alive  . Father  Alive  . Brother  Alive  . MGM  (Not Specified)  . Neg Hx  (Not Specified)        Her family history includes Anxiety disorder in her mother; Breast cancer in her maternal grandmother and mother; Healthy in her brother; Stroke (age of onset: late 52's) in her father; Uterine cancer in her maternal grandmother. There is no history of Colon cancer, Ovarian cancer, or Cervical cancer.      Allergies  Allergen Reactions  . Biaxin  [Clarithromycin]     Vision problems   . Buspirone Other (See Comments)    tremors  . Escitalopram     Eye Problems  . Prednisone   . Amoxicillin Rash  . Ceclor [Cefaclor] Rash  . Codeine Rash     Current Outpatient Medications:  .  Cholecalciferol (VITAMIN D-1000 MAX ST) 1000 UNITS tablet, Take 2,000 Units by mouth daily., Disp: , Rfl:  .  citalopram (CELEXA) 20 MG tablet, Take 1 tablet (20 mg total) by mouth daily., Disp: 90 tablet, Rfl: 3 .  levothyroxine (SYNTHROID, LEVOTHROID) 88 MCG tablet, Take by mouth., Disp: , Rfl:  .  loratadine (CLARITIN) 10 MG tablet, Take 10 mg by mouth., Disp: , Rfl:  .  naproxen sodium (ANAPROX) 220 MG tablet, Take 220 mg by mouth as needed., Disp: , Rfl:  .  norethindrone-ethinyl estradiol (MICROGESTIN) 1-20 MG-MCG tablet, Take 1 tablet by mouth daily., Disp: 3 Package, Rfl: 3 .  fluticasone (FLONASE) 50 MCG/ACT nasal spray, Place 2 sprays into both nostrils daily. (Patient not taking: Reported on 02/24/2018), Disp: 16 g, Rfl: 1   Patient Care Team: Virginia Crews, MD as PCP - General (Family Medicine) Birdie Sons, MD as Referring Physician (Family Medicine) Christene Lye, MD (General Surgery)    Objective:    Vitals: BP 127/85 (BP Location: Right Arm, Patient Position: Sitting, Cuff Size: Normal)   Pulse 97   Temp 98.1 F (36.7 C) (Oral)   Ht '5\' 4"'  (1.626 m)   Wt 144 lb 9.6 oz (65.6 kg)   SpO2 97%   BMI 24.82 kg/m    Vitals:   03/02/19 1455  BP: 127/85  Pulse: 97  Temp: 98.1 F (36.7 C)  TempSrc: Oral  SpO2: 97%  Weight: 144 lb 9.6 oz (65.6 kg)  Height: '5\' 4"'  (1.626 m)     Physical Exam Vitals signs reviewed.  Constitutional:      General: She is not in acute distress.    Appearance: Normal appearance. She is well-developed. She is not diaphoretic.  HENT:     Head: Normocephalic and atraumatic.     Right Ear: Tympanic membrane, ear canal and external ear normal.     Left Ear: Tympanic membrane, ear canal  and external ear normal.  Eyes:     General: No scleral icterus.    Conjunctiva/sclera: Conjunctivae normal.     Pupils: Pupils are equal, round, and reactive to light.  Neck:     Musculoskeletal: Neck supple.     Thyroid: No thyromegaly.  Cardiovascular:     Rate and Rhythm: Normal rate and regular rhythm.     Pulses: Normal pulses.     Heart sounds: Normal heart sounds. No murmur.  Pulmonary:     Effort: Pulmonary effort is  normal. No respiratory distress.     Breath sounds: Normal breath sounds. No wheezing or rales.  Abdominal:     General: There is no distension.     Palpations: Abdomen is soft.     Tenderness: There is no abdominal tenderness.  Genitourinary:    Comments: Breasts: breasts appear normal, no suspicious masses, no skin or nipple changes or axillary nodes.  Musculoskeletal:        General: No deformity.     Right lower leg: No edema.     Left lower leg: No edema.  Lymphadenopathy:     Cervical: No cervical adenopathy.  Skin:    General: Skin is warm and dry.     Capillary Refill: Capillary refill takes less than 2 seconds.     Findings: No rash.  Neurological:     Mental Status: She is alert and oriented to person, place, and time. Mental status is at baseline.  Psychiatric:        Mood and Affect: Mood normal.        Behavior: Behavior normal.        Thought Content: Thought content normal.      Depression Screen PHQ 2/9 Scores 03/02/2019 02/24/2018 01/11/2016  PHQ - 2 Score '4 2 1  ' PHQ- 9 Score 11 4 -       Assessment & Plan:     Routine Health Maintenance and Physical Exam  Exercise Activities and Dietary recommendations Goals   None     Immunization History  Administered Date(s) Administered  . Influenza-Unspecified 04/20/2018  . MMR 06/20/1996  . Tdap 02/22/2015  . Varicella 06/20/1996, 07/25/1996    Health Maintenance  Topic Date Due  . HIV Screening  06/08/1995  . INFLUENZA VACCINE  02/19/2019  . PAP SMEAR-Modifier   02/24/2021  . TETANUS/TDAP  02/21/2025     Discussed health benefits of physical activity, and encouraged her to engage in regular exercise appropriate for her age and condition.    --------------------------------------------------------------------  Problem List Items Addressed This Visit      Endocrine   Papillary thyroid carcinoma ([place])    Followed by Endocrinology        Other   Anxiety    Well controlled Continue Celexa 21m daily      Relevant Medications   citalopram (CELEXA) 20 MG tablet   Menorrhagia   Relevant Medications   norethindrone-ethinyl estradiol (MICROGESTIN) 1-20 MG-MCG tablet   Other Relevant Orders   CBC w/Diff/Platelet   Avitaminosis D    Recheck Vit D level      Relevant Orders   VITAMIN D 25 Hydroxy (Vit-D Deficiency, Fractures)    Other Visit Diagnoses    Encounter for annual physical exam    -  Primary   Relevant Orders   Comprehensive metabolic panel   Lipid panel   CBC w/Diff/Platelet   Screening for HIV (human immunodeficiency virus)       Relevant Orders   HIV antibody (with reflex)       Return in about 1 year (around 03/01/2020) for CPE.   The entirety of the information documented in the History of Present Illness, Review of Systems and Physical Exam were personally obtained by me. Portions of this information were initially documented by NTiburcio Pea CMA and reviewed by me for thoroughness and accuracy.    Felicity Penix, ADionne Bucy MD MPH [facility]

## 2019-03-02 NOTE — Assessment & Plan Note (Signed)
Recheck Vit D level 

## 2019-03-04 LAB — COMPREHENSIVE METABOLIC PANEL
ALT: 23 IU/L (ref 0–32)
AST: 14 IU/L (ref 0–40)
Albumin/Globulin Ratio: 2.5 — ABNORMAL HIGH (ref 1.2–2.2)
Albumin: 4.9 g/dL — ABNORMAL HIGH (ref 3.8–4.8)
Alkaline Phosphatase: 40 IU/L (ref 39–117)
BUN/Creatinine Ratio: 14 (ref 9–23)
BUN: 10 mg/dL (ref 6–20)
Bilirubin Total: 0.2 mg/dL (ref 0.0–1.2)
CO2: 19 mmol/L — ABNORMAL LOW (ref 20–29)
Calcium: 9.4 mg/dL (ref 8.7–10.2)
Chloride: 105 mmol/L (ref 96–106)
Creatinine, Ser: 0.72 mg/dL (ref 0.57–1.00)
GFR calc Af Amer: 123 mL/min/{1.73_m2} (ref >59)
GFR calc non Af Amer: 107 mL/min/{1.73_m2} (ref >59)
Globulin, Total: 2 g/dL (ref 1.5–4.5)
Glucose: 91 mg/dL (ref 65–99)
Potassium: 4.2 mmol/L (ref 3.5–5.2)
Sodium: 140 mmol/L (ref 134–144)
Total Protein: 6.9 g/dL (ref 6.0–8.5)

## 2019-03-04 LAB — LIPID PANEL
Chol/HDL Ratio: 3.8 ratio (ref 0.0–4.4)
Cholesterol, Total: 180 mg/dL (ref 100–199)
HDL: 47 mg/dL (ref >39)
LDL Calculated: 106 mg/dL — ABNORMAL HIGH (ref 0–99)
Triglycerides: 136 mg/dL (ref 0–149)
VLDL Cholesterol Cal: 27 mg/dL (ref 5–40)

## 2019-03-04 LAB — CBC WITH DIFFERENTIAL/PLATELET
Basophils Absolute: 0.1 10*3/uL (ref 0.0–0.2)
Basos: 1 %
EOS (ABSOLUTE): 0.1 10*3/uL (ref 0.0–0.4)
Eos: 1 %
Hematocrit: 38.7 % (ref 34.0–46.6)
Hemoglobin: 12.9 g/dL (ref 11.1–15.9)
Immature Grans (Abs): 0 10*3/uL (ref 0.0–0.1)
Immature Granulocytes: 0 %
Lymphocytes Absolute: 1.6 10*3/uL (ref 0.7–3.1)
Lymphs: 33 %
MCH: 29.5 pg (ref 26.6–33.0)
MCHC: 33.3 g/dL (ref 31.5–35.7)
MCV: 89 fL (ref 79–97)
Monocytes Absolute: 0.3 10*3/uL (ref 0.1–0.9)
Monocytes: 6 %
Neutrophils Absolute: 2.9 10*3/uL (ref 1.4–7.0)
Neutrophils: 59 %
Platelets: 314 10*3/uL (ref 150–450)
RBC: 4.37 x10E6/uL (ref 3.77–5.28)
RDW: 12.6 % (ref 11.7–15.4)
WBC: 4.9 10*3/uL (ref 3.4–10.8)

## 2019-03-04 LAB — HIV ANTIBODY (ROUTINE TESTING W REFLEX): HIV Screen 4th Generation wRfx: NONREACTIVE

## 2019-03-04 LAB — VITAMIN D 25 HYDROXY (VIT D DEFICIENCY, FRACTURES): Vit D, 25-Hydroxy: 36.1 ng/mL (ref 30.0–100.0)

## 2019-03-08 ENCOUNTER — Encounter: Payer: Self-pay | Admitting: Family Medicine

## 2019-04-20 ENCOUNTER — Other Ambulatory Visit: Payer: Self-pay

## 2019-04-20 ENCOUNTER — Ambulatory Visit (INDEPENDENT_AMBULATORY_CARE_PROVIDER_SITE_OTHER): Payer: BC Managed Care – PPO

## 2019-04-20 DIAGNOSIS — Z23 Encounter for immunization: Secondary | ICD-10-CM | POA: Diagnosis not present

## 2019-09-30 ENCOUNTER — Ambulatory Visit: Payer: BC Managed Care – PPO | Attending: Internal Medicine

## 2019-09-30 DIAGNOSIS — Z23 Encounter for immunization: Secondary | ICD-10-CM

## 2019-09-30 NOTE — Progress Notes (Signed)
   Covid-19 Vaccination Clinic  Name:  Latasha Cannon    MRN: [id number] DOB: Jan 22, 1981  09/30/2019  [name] was observed post Covid-19 immunization for 15 minutes without incident. She was provided with Vaccine Information Sheet and instruction to access the V-Safe system.   [name] was instructed to call 911 with any severe reactions post vaccine: Marland Kitchen Difficulty breathing  . Swelling of face and throat  . A fast heartbeat  . A bad rash all over body  . Dizziness and weakness   Immunizations Administered    Name Date Dose VIS Date Route   Pfizer COVID-19 Vaccine 09/30/2019  8:22 AM 0.3 mL 07/01/2019 Intramuscular   Manufacturer: [place]   Lot: [id number]   [place]: [id number]

## 2019-10-26 ENCOUNTER — Ambulatory Visit: Payer: BC Managed Care – PPO | Attending: Internal Medicine

## 2019-10-26 DIAGNOSIS — Z23 Encounter for immunization: Secondary | ICD-10-CM

## 2019-10-26 NOTE — Progress Notes (Signed)
   Covid-19 Vaccination Clinic  Name:  Latasha Cannon    MRN: [id number] DOB: [birth year]  10/26/2019  Latasha Cannon was observed post Covid-19 immunization for 15 minutes without incident. She was provided with Vaccine Information Sheet and instruction to access the V-Safe system.   Latasha Cannon was instructed to call 911 with any severe reactions post vaccine: Marland Kitchen Difficulty breathing  . Swelling of face and throat  . A fast heartbeat  . A bad rash all over body  . Dizziness and weakness   Immunizations Administered    Name Date Dose VIS Date Route   Pfizer COVID-19 Vaccine 10/26/2019  8:11 AM 0.3 mL 07/01/2019 Intramuscular   Manufacturer: [place]   Lot: [id number]   [place]: [id number]

## 2020-02-01 ENCOUNTER — Telehealth: Payer: Self-pay

## 2020-02-01 ENCOUNTER — Telehealth (INDEPENDENT_AMBULATORY_CARE_PROVIDER_SITE_OTHER): Payer: BC Managed Care – PPO | Admitting: Physician Assistant

## 2020-02-01 DIAGNOSIS — G43809 Other migraine, not intractable, without status migrainosus: Secondary | ICD-10-CM | POA: Diagnosis not present

## 2020-02-01 NOTE — Progress Notes (Signed)
MyChart Video Visit    Virtual Visit via Video Note   This visit type was conducted due to national recommendations for restrictions regarding the COVID-19 Pandemic (e.g. social distancing) in an effort to limit this patient's exposure and mitigate transmission in our community. This patient is at least at moderate risk for complications without adequate follow up. This format is felt to be most appropriate for this patient at this time. Physical exam was limited by quality of the video and audio technology used for the visit.   Patient location: Home Provider location: Office    I discussed the limitations of evaluation and management by telemedicine and the availability of in person appointments. The patient expressed understanding and agreed to proceed.  Patient: Latasha Cannon   DOB: 1980-12-16   39 y.o. Female  MRN: [id number] Visit Date: 02/01/2020  Today's healthcare provider: Trinna Post, PA-C   Chief Complaint  Patient presents with  . Headache  . Sinusitis   Subjective    HPI   Patient reports one week of sinus congestions and significant headache. She did not have success with OTC treatment. She Reports having a headache and change in vision. Reports the road got blurry when she was driving for several days. She reports her vision became unfocused and she couldn't read the signs. She ultimately went to the eye doctor and reports that her eye exam was normal. She reports when she saw her eye doctor, the vision had improved. She did have some relief with an ice pack. Reports she is overall better, has some residual congestion. She has used some flonase intermittently. Denies weakness in her arms and legs. She did have some intermittent nausea. She reports having a history of mirgaines and reports that her headache    Medications: Outpatient Medications Prior to Visit  Medication Sig  . Cholecalciferol (VITAMIN D-1000 MAX ST) 1000 UNITS tablet Take 2,000 Units by  mouth daily.  . citalopram (CELEXA) 20 MG tablet Take 1 tablet (20 mg total) by mouth daily.  . fluticasone (FLONASE) 50 MCG/ACT nasal spray Place 2 sprays into both nostrils daily. (Patient not taking: Reported on 02/24/2018)  . levothyroxine (SYNTHROID, LEVOTHROID) 88 MCG tablet Take by mouth.  . loratadine (CLARITIN) 10 MG tablet Take 10 mg by mouth.  . naproxen sodium (ANAPROX) 220 MG tablet Take 220 mg by mouth as needed.  . norethindrone-ethinyl estradiol (MICROGESTIN) 1-20 MG-MCG tablet Take 1 tablet by mouth daily.   No facility-administered medications prior to visit.    Review of Systems    Objective    There were no vitals taken for this visit.   Physical Exam Constitutional:      Appearance: She is well-developed.  Neurological:     Mental Status: She is alert.     GCS: GCS eye subscore is 4. GCS verbal subscore is 5. GCS motor subscore is 6.  Psychiatric:        Mood and Affect: Mood normal.        Behavior: Behavior normal.        Assessment & Plan    1. Other migraine without status migrainosus, not intractable  Suspect migraine syndrome based on presentation and visual changes. Would recommend switching OCP to non-estrogen form. With her demographic must always consider MS, may need MRI brain in the future. Can also consider triptans, nurtec, etc. Patient would like to discuss with her PCP about steps moving forward.     No follow-ups on file.  I discussed the assessment and treatment plan with the patient. The patient was provided an opportunity to ask questions and all were answered. The patient agreed with the plan and demonstrated an understanding of the instructions.   The patient was advised to call back or seek an in-person evaluation if the symptoms worsen or if the condition fails to improve as anticipated.   ITrinna Post, PA-C, have reviewed all documentation for this visit. The documentation on 02/03/20 for the exam, diagnosis,  procedures, and orders are all accurate and complete.    Paulene Floor [facility] [phone] (phone) [phone] (fax)  [place]

## 2020-02-01 NOTE — Telephone Encounter (Signed)
Advised pt that this would need to be a virtual visit. Pt verbalized understanding.

## 2020-02-01 NOTE — Telephone Encounter (Signed)
Copied from [place] [id number]. Topic: Appointment Scheduling - Scheduling Inquiry for Clinic >> Feb 01, 2020  8:08 AM Latasha Cannon wrote: Patient has a MyChart Video with Adriana P. Today to address sinus pressure, ear ache. Patient would like her ears check, requesting in office appt and would like a follow up call as soon as possible

## 2020-02-01 NOTE — Telephone Encounter (Signed)
If she has sinus congestion it will have to be virtual if those symptoms are within ten days.

## 2020-02-22 ENCOUNTER — Encounter: Payer: BC Managed Care – PPO | Admitting: Dermatology

## 2020-02-28 ENCOUNTER — Telehealth: Payer: Self-pay

## 2020-02-28 ENCOUNTER — Encounter: Payer: Self-pay | Admitting: Family Medicine

## 2020-02-28 MED ORDER — CITALOPRAM HYDROBROMIDE 20 MG PO TABS: 20.0000 mg | ORAL_TABLET | Freq: Every day | ORAL | 3 refills | Status: DC

## 2020-02-28 NOTE — Telephone Encounter (Signed)
RX sent to pharmacy  

## 2020-03-01 ENCOUNTER — Encounter: Payer: Self-pay | Admitting: Family Medicine

## 2020-03-01 DIAGNOSIS — E785 Hyperlipidemia, unspecified: Secondary | ICD-10-CM

## 2020-03-01 DIAGNOSIS — Z1159 Encounter for screening for other viral diseases: Secondary | ICD-10-CM

## 2020-03-01 DIAGNOSIS — N920 Excessive and frequent menstruation with regular cycle: Secondary | ICD-10-CM

## 2020-03-01 DIAGNOSIS — C73 Malignant neoplasm of thyroid gland: Secondary | ICD-10-CM

## 2020-03-01 DIAGNOSIS — E559 Vitamin D deficiency, unspecified: Secondary | ICD-10-CM

## 2020-03-02 NOTE — Telephone Encounter (Signed)
Ok to order CMP, CBC, lipid panel, and Vit D. Thanks!

## 2020-03-06 LAB — COMPREHENSIVE METABOLIC PANEL
ALT: 27 IU/L (ref 0–32)
AST: 18 IU/L (ref 0–40)
Albumin/Globulin Ratio: 2 (ref 1.2–2.2)
Albumin: 4.7 g/dL (ref 3.8–4.8)
Alkaline Phosphatase: 47 IU/L — ABNORMAL LOW (ref 48–121)
BUN/Creatinine Ratio: 13 (ref 9–23)
BUN: 10 mg/dL (ref 6–20)
Bilirubin Total: <0.2 mg/dL (ref 0.0–1.2)
CO2: 17 mmol/L — ABNORMAL LOW (ref 20–29)
Calcium: 9.2 mg/dL (ref 8.7–10.2)
Chloride: 102 mmol/L (ref 96–106)
Creatinine, Ser: 0.76 mg/dL (ref 0.57–1.00)
GFR calc Af Amer: 114 mL/min/{1.73_m2} (ref >59)
GFR calc non Af Amer: 99 mL/min/{1.73_m2} (ref >59)
Globulin, Total: 2.4 g/dL (ref 1.5–4.5)
Glucose: 94 mg/dL (ref 65–99)
Potassium: 4.2 mmol/L (ref 3.5–5.2)
Sodium: 138 mmol/L (ref 134–144)
Total Protein: 7.1 g/dL (ref 6.0–8.5)

## 2020-03-06 LAB — CBC WITH DIFFERENTIAL/PLATELET
Basophils Absolute: 0.1 10*3/uL (ref 0.0–0.2)
Basos: 1 %
EOS (ABSOLUTE): 0.1 10*3/uL (ref 0.0–0.4)
Eos: 2 %
Hematocrit: 38.1 % (ref 34.0–46.6)
Hemoglobin: 13.2 g/dL (ref 11.1–15.9)
Immature Grans (Abs): 0 10*3/uL (ref 0.0–0.1)
Immature Granulocytes: 0 %
Lymphocytes Absolute: 1.3 10*3/uL (ref 0.7–3.1)
Lymphs: 30 %
MCH: 30.1 pg (ref 26.6–33.0)
MCHC: 34.6 g/dL (ref 31.5–35.7)
MCV: 87 fL (ref 79–97)
Monocytes Absolute: 0.3 10*3/uL (ref 0.1–0.9)
Monocytes: 6 %
Neutrophils Absolute: 2.6 10*3/uL (ref 1.4–7.0)
Neutrophils: 61 %
Platelets: 303 10*3/uL (ref 150–450)
RBC: 4.38 x10E6/uL (ref 3.77–5.28)
RDW: 12.4 % (ref 11.7–15.4)
WBC: 4.4 10*3/uL (ref 3.4–10.8)

## 2020-03-06 LAB — LIPID PANEL WITH LDL/HDL RATIO
Cholesterol, Total: 199 mg/dL (ref 100–199)
HDL: 46 mg/dL (ref >39)
LDL Chol Calc (NIH): 132 mg/dL — ABNORMAL HIGH (ref 0–99)
LDL/HDL Ratio: 2.9 ratio (ref 0.0–3.2)
Triglycerides: 114 mg/dL (ref 0–149)
VLDL Cholesterol Cal: 21 mg/dL (ref 5–40)

## 2020-03-06 LAB — VITAMIN D 25 HYDROXY (VIT D DEFICIENCY, FRACTURES): Vit D, 25-Hydroxy: 41.8 ng/mL (ref 30.0–100.0)

## 2020-03-06 LAB — TSH: TSH: 3.97 u[IU]/mL (ref 0.450–4.500)

## 2020-03-06 LAB — HEPATITIS C ANTIBODY: Hep C Virus Ab: <0.1 s/co ratio (ref 0.0–0.9)

## 2020-03-07 ENCOUNTER — Other Ambulatory Visit: Payer: Self-pay

## 2020-03-07 ENCOUNTER — Encounter: Payer: Self-pay | Admitting: Family Medicine

## 2020-03-07 ENCOUNTER — Ambulatory Visit (INDEPENDENT_AMBULATORY_CARE_PROVIDER_SITE_OTHER): Payer: BC Managed Care – PPO | Admitting: Family Medicine

## 2020-03-07 VITALS — BP 116/82 | HR 93 | Temp 98.4°F | Ht 64.0 in | Wt 140.0 lb

## 2020-03-07 DIAGNOSIS — G43109 Migraine with aura, not intractable, without status migrainosus: Secondary | ICD-10-CM | POA: Diagnosis not present

## 2020-03-07 DIAGNOSIS — H04122 Dry eye syndrome of left lacrimal gland: Secondary | ICD-10-CM

## 2020-03-07 DIAGNOSIS — Z0000 Encounter for general adult medical examination without abnormal findings: Secondary | ICD-10-CM | POA: Diagnosis not present

## 2020-03-07 DIAGNOSIS — F419 Anxiety disorder, unspecified: Secondary | ICD-10-CM

## 2020-03-07 DIAGNOSIS — Z1231 Encounter for screening mammogram for malignant neoplasm of breast: Secondary | ICD-10-CM

## 2020-03-07 MED ORDER — SUMATRIPTAN SUCCINATE 50 MG PO TABS: 50.0000 mg | ORAL_TABLET | ORAL | 5 refills | Status: DC | PRN

## 2020-03-07 NOTE — Patient Instructions (Signed)
Preventive Care 21-39 Years Old, Female Preventive care refers to visits with your health care provider and lifestyle choices that can promote health and wellness. This includes:  A yearly physical exam. This may also be called an annual well check.  Regular dental visits and eye exams.  Immunizations.  Screening for certain conditions.  Healthy lifestyle choices, such as eating a healthy diet, getting regular exercise, not using drugs or products that contain nicotine and tobacco, and limiting alcohol use. What can I expect for my preventive care visit? Physical exam Your health care provider will check your:  Height and weight. This may be used to calculate body mass index (BMI), which tells if you are at a healthy weight.  Heart rate and blood pressure.  Skin for abnormal spots. Counseling Your health care provider may ask you questions about your:  Alcohol, tobacco, and drug use.  Emotional well-being.  Home and relationship well-being.  Sexual activity.  Eating habits.  Work and work environment.  Method of birth control.  Menstrual cycle.  Pregnancy history. What immunizations do I need?  Influenza (flu) vaccine  This is recommended every year. Tetanus, diphtheria, and pertussis (Tdap) vaccine  You may need a Td booster every 10 years. Varicella (chickenpox) vaccine  You may need this if you have not been vaccinated. Human papillomavirus (HPV) vaccine  If recommended by your health care provider, you may need three doses over 6 months. Measles, mumps, and rubella (MMR) vaccine  You may need at least one dose of MMR. You may also need a second dose. Meningococcal conjugate (MenACWY) vaccine  One dose is recommended if you are age 19-21 years and a first-year college student living in a residence hall, or if you have one of several medical conditions. You may also need additional booster doses. Pneumococcal conjugate (PCV13) vaccine  You may need  this if you have certain conditions and were not previously vaccinated. Pneumococcal polysaccharide (PPSV23) vaccine  You may need one or two doses if you smoke cigarettes or if you have certain conditions. Hepatitis A vaccine  You may need this if you have certain conditions or if you travel or work in places where you may be exposed to hepatitis A. Hepatitis B vaccine  You may need this if you have certain conditions or if you travel or work in places where you may be exposed to hepatitis B. Haemophilus influenzae type b (Hib) vaccine  You may need this if you have certain conditions. You may receive vaccines as individual doses or as more than one vaccine together in one shot (combination vaccines). Talk with your health care provider about the risks and benefits of combination vaccines. What tests do I need?  Blood tests  Lipid and cholesterol levels. These may be checked every 5 years starting at age 20.  Hepatitis C test.  Hepatitis B test. Screening  Diabetes screening. This is done by checking your blood sugar (glucose) after you have not eaten for a while (fasting).  Sexually transmitted disease (STD) testing.  BRCA-related cancer screening. This may be done if you have a family history of breast, ovarian, tubal, or peritoneal cancers.  Pelvic exam and Pap test. This may be done every 3 years starting at age 21. Starting at age 30, this may be done every 5 years if you have a Pap test in combination with an HPV test. Talk with your health care provider about your test results, treatment options, and if necessary, the need for more tests.   Follow these instructions at home: Eating and drinking   Eat a diet that includes fresh fruits and vegetables, whole grains, lean protein, and low-fat dairy.  Take vitamin and mineral supplements as recommended by your health care provider.  Do not drink alcohol if: ? Your health care provider tells you not to drink. ? You are  pregnant, may be pregnant, or are planning to become pregnant.  If you drink alcohol: ? Limit how much you have to 0-1 drink a day. ? Be aware of how much alcohol is in your drink. In the [place] one drink equals one 12 oz bottle of beer (355 mL), one 5 oz glass of wine (148 mL), or one 1 oz glass of hard liquor (44 mL). Lifestyle  Take daily care of your teeth and gums.  Stay active. Exercise for at least 30 minutes on 5 or more days each week.  Do not use any products that contain nicotine or tobacco, such as cigarettes, e-cigarettes, and chewing tobacco. If you need help quitting, ask your health care provider.  If you are sexually active, practice safe sex. Use a condom or other form of birth control (contraception) in order to prevent pregnancy and STIs (sexually transmitted infections). If you plan to become pregnant, see your health care provider for a preconception visit. What's next?  Visit your health care provider once a year for a well check visit.  Ask your health care provider how often you should have your eyes and teeth checked.  Stay up to date on all vaccines. This information is not intended to replace advice given to you by your health care provider. Make sure you discuss any questions you have with your health care provider. Document Revised: 03/18/2018 Document Reviewed: 03/18/2018 Elsevier Patient Education  2020 [employer].

## 2020-03-07 NOTE — Progress Notes (Signed)
Complete physical exam   Patient: Latasha Cannon   DOB: 1981/02/08   39 y.o. Female  MRN: [id number] Visit Date: 03/07/2020  Today's healthcare provider: Lavon Paganini, MD   Chief Complaint  Patient presents with  . Annual Exam   Subjective    Latasha Cannon is a 39 y.o. female who presents today for a complete physical exam.  She reports consuming a general diet. The patient does not participate in regular exercise at present. She generally feels well. She reports sleeping fairly well. She does not have additional problems to discuss today.  HPI   Latasha Cannon reports worsening migraines with nausea and head pounding. She is having approximately 1 migraine per month. The only thing that helps is cold washcloth on the face and resting in a dark room. She has tried aleve with no relief, and was told by a pharmacist a few years ago to avoid excedrin with her high heart rate.  She is also having dryness of the left eye with a feeling of slight sinus pressure below the eye for the past couple of weeks. She thought this may be related to eye strain, so she got a new glasses prescription. She has been using OTC eye drops (Sustain) and they work temporarily.  In regards to her anxiety, Latasha Cannon reports she has been doing well on her Citalopram. She has noticed increased anxiety and trouble sleeping these past couple of months with the stresses of work and Covid-19 as well as TSH lab results.   History reviewed. No pertinent past medical history. Past Surgical History:  Procedure Laterality Date  . THYROIDECTOMY  07/2013   due to thyroid cancer  . WISDOM TOOTH EXTRACTION  2004   Social History   Socioeconomic History  . Marital status: Married    Spouse name: Latasha Cannon  . Number of children: 0  . Years of education: 16  . Highest education level: Bachelor's degree (e.g., BA, AB, BS)  Occupational History  . Not on file  Tobacco Use  . Smoking status: Never Smoker  .  Smokeless tobacco: Never Used  Vaping Use  . Vaping Use: Never used  Substance and Sexual Activity  . Alcohol use: No  . Drug use: No  . Sexual activity: Not on file  Other Topics Concern  . Not on file  Social History Narrative  . Not on file   Social Determinants of Health   Financial Resource Strain:   . Difficulty of Paying Living Expenses:   Food Insecurity:   . Worried About Charity fundraiser in the Last Year:   . Arboriculturist in the Last Year:   Transportation Needs:   . Film/video editor (Medical):   Marland Kitchen Lack of Transportation (Non-Medical):   Physical Activity:   . Days of Exercise per Week:   . Minutes of Exercise per Session:   Stress:   . Feeling of Stress :   Social Connections:   . Frequency of Communication with Friends and Family:   . Frequency of Social Gatherings with Friends and Family:   . Attends Religious Services:   . Active Member of Clubs or Organizations:   . Attends Archivist Meetings:   Marland Kitchen Marital Status:   Intimate Partner Violence:   . Fear of Current or Ex-Partner:   . Emotionally Abused:   Marland Kitchen Physically Abused:   . Sexually Abused:    Family Status  Relation Name Status  .  Mother  Alive  . Father  Alive  . Brother  Alive  . MGM  Deceased  . Neg Hx  (Not Specified)   Family History  Problem Relation Age of Onset  . Anxiety disorder Mother   . Breast cancer Mother   . Stroke Father late 69's  . Healthy Brother   . Breast cancer Maternal Grandmother   . Uterine cancer Maternal Grandmother   . Colon cancer Neg Hx   . Ovarian cancer Neg Hx   . Cervical cancer Neg Hx    Allergies  Allergen Reactions  . Biaxin [Clarithromycin]     Vision problems   . Buspirone Other (See Comments)    tremors  . Escitalopram     Eye Problems  . Prednisone   . Amoxicillin Rash  . Ceclor [Cefaclor] Rash  . Codeine Rash    Patient Care Team: Virginia Crews, MD as PCP - General (Family Medicine) Birdie Sons, MD  as Referring Physician (Family Medicine) Christene Lye, MD (General Surgery)   Medications: Outpatient Medications Prior to Visit  Medication Sig  . Cholecalciferol (VITAMIN D-1000 MAX ST) 1000 UNITS tablet Take 2,000 Units by mouth daily.  . citalopram (CELEXA) 20 MG tablet Take 1 tablet (20 mg total) by mouth daily.  Marland Kitchen levothyroxine (SYNTHROID, LEVOTHROID) 88 MCG tablet Take by mouth.  . loratadine (CLARITIN) 10 MG tablet Take 10 mg by mouth.  . naproxen sodium (ANAPROX) 220 MG tablet Take 220 mg by mouth as needed.  . [DISCONTINUED] norethindrone-ethinyl estradiol (MICROGESTIN) 1-20 MG-MCG tablet Take 1 tablet by mouth daily.  . [DISCONTINUED] fluticasone (FLONASE) 50 MCG/ACT nasal spray Place 2 sprays into both nostrils daily. (Patient not taking: Reported on 02/24/2018)   No facility-administered medications prior to visit.    Review of Systems  Constitutional: Positive for fatigue. Negative for activity change, appetite change, chills, diaphoresis, fever and unexpected weight change.  HENT: Positive for sinus pressure and sneezing. Negative for congestion, dental problem, drooling, ear discharge, ear pain, facial swelling, hearing loss, mouth sores, nosebleeds, postnasal drip, rhinorrhea, sinus pain, sore throat, tinnitus, trouble swallowing and voice change.   Eyes: Negative.   Respiratory: Negative.   Cardiovascular: Negative.   Gastrointestinal: Negative.   Endocrine: Negative.   Genitourinary: Negative.   Musculoskeletal: Negative.   Skin: Negative.   Allergic/Immunologic: Negative.   Neurological: Negative.   Hematological: Negative.   Psychiatric/Behavioral: Negative for agitation, behavioral problems, confusion, decreased concentration, dysphoric mood, hallucinations, self-injury, sleep disturbance and suicidal ideas. The patient is nervous/anxious. The patient is not hyperactive.     Last CBC Lab Results  Component Value Date   WBC 4.4 03/05/2020   HGB 13.2  03/05/2020   HCT 38.1 03/05/2020   MCV 87 03/05/2020   MCH 30.1 03/05/2020   RDW 12.4 03/05/2020   PLT 303 16/04/9603   Last metabolic panel Lab Results  Component Value Date   GLUCOSE 94 03/05/2020   NA 138 03/05/2020   K 4.2 03/05/2020   CL 102 03/05/2020   CO2 17 (L) 03/05/2020   BUN 10 03/05/2020   CREATININE 0.76 03/05/2020   GFRNONAA 99 03/05/2020   GFRAA 114 03/05/2020   CALCIUM 9.2 03/05/2020   PROT 7.1 03/05/2020   ALBUMIN 4.7 03/05/2020   LABGLOB 2.4 03/05/2020   AGRATIO 2.0 03/05/2020   BILITOT <0.2 03/05/2020   ALKPHOS 47 (L) 03/05/2020   AST 18 03/05/2020   ALT 27 03/05/2020   Last lipids Lab Results  Component Value Date  CHOL 199 03/05/2020   HDL 46 03/05/2020   LDLCALC 132 (H) 03/05/2020   TRIG 114 03/05/2020   CHOLHDL 3.8 03/03/2019   Last thyroid functions Lab Results  Component Value Date   TSH 3.970 03/05/2020   T4TOTAL 11.6 06/27/2013   Last vitamin D Lab Results  Component Value Date   VD25OH 41.8 03/05/2020      Objective    BP 116/82 (BP Location: Left Arm, Patient Position: Sitting, Cuff Size: Large)   Pulse 93   Temp 98.4 F (36.9 C) (Oral)   Ht '5\' 4"'  (1.626 m)   Wt 140 lb (63.5 kg)   SpO2 99%   BMI 24.03 kg/m     Physical Exam Constitutional:      General: She is not in acute distress.    Appearance: Normal appearance. She is normal weight. She is not ill-appearing, toxic-appearing or diaphoretic.  HENT:     Head: Normocephalic and atraumatic.     Right Ear: External ear normal.     Left Ear: External ear normal.  Eyes:     Conjunctiva/sclera: Conjunctivae normal.  Neck:     Thyroid: No thyromegaly or thyroid tenderness.     Comments: Thyroid absent Cardiovascular:     Rate and Rhythm: Normal rate and regular rhythm.     Heart sounds: Normal heart sounds. No murmur heard.   Pulmonary:     Effort: Pulmonary effort is normal. No respiratory distress.     Breath sounds: Normal breath sounds. No wheezing.    Chest:     Chest wall: No tenderness.  Abdominal:     General: Abdomen is flat.     Palpations: Abdomen is soft.     Tenderness: There is no abdominal tenderness. There is no guarding.  Musculoskeletal:        General: Normal range of motion.     Cervical back: Normal range of motion and neck supple.  Lymphadenopathy:     Cervical: No cervical adenopathy.  Skin:    General: Skin is warm and dry.  Neurological:     General: No focal deficit present.     Mental Status: She is alert. Mental status is at baseline.  Psychiatric:        Mood and Affect: Mood normal.        Behavior: Behavior normal.        Thought Content: Thought content normal.        Judgment: Judgment normal.     Last depression screening scores PHQ 2/9 Scores 03/07/2020 03/02/2019 02/24/2018  PHQ - 2 Score '2 4 2  ' PHQ- 9 Score '5 11 4   ' Last fall risk screening Fall Risk  03/07/2020  Falls in the past year? 0  Number falls in past yr: 0  Injury with Fall? 0  Follow up Falls evaluation completed   Last Audit-C alcohol use screening Alcohol Use Disorder Test (AUDIT) 03/07/2020  1. How often do you have a drink containing alcohol? 1  2. How many drinks containing alcohol do you have on a typical day when you are drinking? 0  3. How often do you have six or more drinks on one occasion? 0  AUDIT-C Score 1  Alcohol Brief Interventions/Follow-up AUDIT Score <7 follow-up not indicated   A score of 3 or more in women, and 4 or more in men indicates increased risk for alcohol abuse, EXCEPT if all of the points are from question 1   No results found for any visits  on 03/07/20.  Assessment & Plan    Routine Health Maintenance and Physical Exam  Exercise Activities and Dietary recommendations Goals   None     Immunization History  Administered Date(s) Administered  . Influenza,inj,Quad PF,6+ Mos 04/20/2019  . Influenza-Unspecified 04/20/2018  . MMR 06/20/1996  . PFIZER SARS-COV-2 Vaccination 09/30/2019,  10/26/2019  . Tdap 02/22/2015  . Varicella 06/20/1996, 07/25/1996    Health Maintenance  Topic Date Due  . INFLUENZA VACCINE  02/19/2020  . PAP SMEAR-Modifier  02/25/2023  . TETANUS/TDAP  02/21/2025  . COVID-19 Vaccine  Completed  . Hepatitis C Screening  Completed  . HIV Screening  Completed    Discussed health benefits of physical activity, and encouraged her to engage in regular exercise appropriate for her age and condition.  1. Encounter for annual physical exam Had lab work prior to appointment. Results of lab work discussed. Discussed importance of healthy diet and exercise.   2. Breast cancer screening by mammogram Maternal history of breast cancer - MM 3D SCREEN BREAST BILATERAL; Future  3. Anxiety Improved from one year ago on citalopram, but worsening these last 3-4 months.  Somewhat situational with current stressors - Continue citalopram - If no improvement in anxiety symptoms after endocrinology appointment next week, contact Dr. Brita Romp for consideration of dosage increase.  4. Migraine with aura and without status migrainosus, not intractable Worsening with no relief from aleve. Patient was on combined OCPs and has a family history of stroke in father. - Stop birth control pills (use barrier method for contraception) - avoid estrogen given aura  - Start sumatriptan - consider preventive therapy in the future  5. Dry eye, right May be related to allergies, although unilateral presentation is concerning for another ocular pathology. - Continue Sustain eye drops - Start Flonase - If no improvement in dry eye symptoms, contact Dr. Brita Romp for referral to ophthalmoogy   Return in about 1 year (around 03/07/2021) for CPE.     Rodrigo Ran, MS3

## 2020-03-11 ENCOUNTER — Encounter: Payer: Self-pay | Admitting: Family Medicine

## 2020-03-12 MED ORDER — DOXYCYCLINE HYCLATE 100 MG PO TABS: 100.0000 mg | ORAL_TABLET | Freq: Two times a day (BID) | ORAL | 0 refills | Status: AC

## 2020-03-12 NOTE — Telephone Encounter (Signed)
Ok to send in Doxycycline 100mg  BID x7 d #14 r0. Please advise her to also get a COVID test.

## 2020-04-11 ENCOUNTER — Ambulatory Visit (INDEPENDENT_AMBULATORY_CARE_PROVIDER_SITE_OTHER): Payer: BC Managed Care – PPO

## 2020-04-11 ENCOUNTER — Other Ambulatory Visit: Payer: Self-pay

## 2020-04-11 DIAGNOSIS — Z23 Encounter for immunization: Secondary | ICD-10-CM

## 2020-07-10 ENCOUNTER — Other Ambulatory Visit: Payer: Self-pay

## 2020-07-10 ENCOUNTER — Ambulatory Visit
Admission: RE | Admit: 2020-07-10 | Discharge: 2020-07-10 | Disposition: A | Discharge status: Home / Self Care | Payer: BC Managed Care – PPO | Source: Ambulatory Visit | Attending: Family Medicine | Admitting: Family Medicine

## 2020-07-10 DIAGNOSIS — Z1231 Encounter for screening mammogram for malignant neoplasm of breast: Secondary | ICD-10-CM | POA: Insufficient documentation

## 2020-08-02 ENCOUNTER — Encounter: Payer: Self-pay | Admitting: Family Medicine

## 2020-08-02 ENCOUNTER — Other Ambulatory Visit: Payer: Self-pay

## 2020-08-02 DIAGNOSIS — Z20822 Contact with and (suspected) exposure to covid-19: Secondary | ICD-10-CM

## 2020-08-04 LAB — SARS-COV-2, NAA 2 DAY TAT

## 2020-08-04 LAB — NOVEL CORONAVIRUS, NAA: SARS-CoV-2, NAA: NOT DETECTED

## 2020-08-15 ENCOUNTER — Other Ambulatory Visit: Payer: Self-pay

## 2020-08-15 DIAGNOSIS — Z20822 Contact with and (suspected) exposure to covid-19: Secondary | ICD-10-CM

## 2020-08-17 LAB — NOVEL CORONAVIRUS, NAA: SARS-CoV-2, NAA: NOT DETECTED

## 2020-08-17 LAB — SARS-COV-2, NAA 2 DAY TAT

## 2020-08-29 ENCOUNTER — Ambulatory Visit: Payer: BC Managed Care – PPO | Admitting: Dermatology

## 2020-08-29 ENCOUNTER — Other Ambulatory Visit: Payer: Self-pay

## 2020-08-29 ENCOUNTER — Other Ambulatory Visit: Payer: Self-pay | Admitting: Dermatology

## 2020-08-29 ENCOUNTER — Encounter: Payer: Self-pay | Admitting: Dermatology

## 2020-08-29 DIAGNOSIS — Z86018 Personal history of other benign neoplasm: Secondary | ICD-10-CM

## 2020-08-29 DIAGNOSIS — L821 Other seborrheic keratosis: Secondary | ICD-10-CM

## 2020-08-29 DIAGNOSIS — Z1283 Encounter for screening for malignant neoplasm of skin: Secondary | ICD-10-CM

## 2020-08-29 DIAGNOSIS — D039 Melanoma in situ, unspecified: Secondary | ICD-10-CM

## 2020-08-29 DIAGNOSIS — L578 Other skin changes due to chronic exposure to nonionizing radiation: Secondary | ICD-10-CM

## 2020-08-29 DIAGNOSIS — L858 Other specified epidermal thickening: Secondary | ICD-10-CM

## 2020-08-29 DIAGNOSIS — D485 Neoplasm of uncertain behavior of skin: Secondary | ICD-10-CM

## 2020-08-29 DIAGNOSIS — L814 Other melanin hyperpigmentation: Secondary | ICD-10-CM

## 2020-08-29 DIAGNOSIS — D1800 Hemangioma unspecified site: Secondary | ICD-10-CM

## 2020-08-29 DIAGNOSIS — D229 Melanocytic nevi, unspecified: Secondary | ICD-10-CM

## 2020-08-29 HISTORY — DX: Melanoma in situ, unspecified: D03.9

## 2020-08-29 NOTE — Progress Notes (Signed)
   Follow-Up Visit   Subjective  Latasha Cannon is a 40 y.o. female who presents for the following: Annual Exam (Hx dysplastic nevus ). The patient presents for Total-Body Skin Exam (TBSE) for skin cancer screening and mole check.  The following portions of the chart were reviewed this encounter and updated as appropriate:   Tobacco  Allergies  Meds  Problems  Med Hx  Surg Hx  Fam Hx     Review of Systems:  No other skin or systemic complaints except as noted in HPI or Assessment and Plan.  Objective  Well appearing patient in no apparent distress; mood and affect are within normal limits.  A full examination was performed including scalp, head, eyes, ears, nose, lips, neck, chest, axillae, abdomen, back, buttocks, bilateral upper extremities, bilateral lower extremities, hands, feet, fingers, toes, fingernails, and toenails. All findings within normal limits unless otherwise noted below.  Objective  B/L leg: Tiny follicular keratotic papules.   Objective  R prox dorsum med great toe: Irregular brown macule 0.6 cm   Assessment & Plan  Keratosis pilaris B/L legs; arms Benign, observe.  AmLactin rapid relief recommended.  Neoplasm of uncertain behavior of skin R prox dorsum med great toe Epidermal / dermal shaving  Lesion diameter (cm):  0.6 Informed consent: discussed and consent obtained   Timeout: patient name, date of birth, surgical site, and procedure verified   Procedure prep:  Patient was prepped and draped in usual sterile fashion Prep type:  Isopropyl alcohol Anesthesia: the lesion was anesthetized in a standard fashion   Anesthetic:  1% lidocaine w/ epinephrine 1-100,000 buffered w/ 8.4% NaHCO3 Instrument used: flexible razor blade   Hemostasis achieved with: pressure, aluminum chloride and electrodesiccation   Outcome: patient tolerated procedure well   Post-procedure details: sterile dressing applied and wound care instructions given   Dressing type:  bandage and petrolatum    Specimen 1 - Surgical pathology Differential Diagnosis: D48.5 r/o dysplastic nevus  Check Margins: No   Lentigines - Scattered tan macules - Discussed due to sun exposure - Benign, observe - Call for any changes  Seborrheic Keratoses - Stuck-on, waxy, tan-brown papules and plaques  - Discussed benign etiology and prognosis. - Observe - Call for any changes  Melanocytic Nevi - Tan-brown and/or pink-flesh-colored symmetric macules and papules - Benign appearing on exam today - Observation - Call clinic for new or changing moles - Recommend daily use of broad spectrum spf 30+ sunscreen to sun-exposed areas.   Hemangiomas - Red papules - Discussed benign nature - Observe - Call for any changes  Actinic Damage - Chronic, secondary to cumulative UV/sun exposure - diffuse scaly erythematous macules with underlying dyspigmentation - Recommend daily broad spectrum sunscreen SPF 30+ to sun-exposed areas, reapply every 2 hours as needed.  - Call for new or changing lesions.  History of Dysplastic Nevus - No evidence of recurrence today - Recommend regular full body skin exams - Recommend daily broad spectrum sunscreen SPF 30+ to sun-exposed areas, reapply every 2 hours as needed.  - Call if any new or changing lesions are noted between office visits  Skin cancer screening performed today.  Return in about 1 year (around 08/29/2021) for TBSE - hx dysplastic nevus.  Luther Redo, CMA, am acting as scribe for Sarina Ser, MD .  Documentation: I have reviewed the above documentation for accuracy and completeness, and I agree with the above.  Sarina Ser, MD

## 2020-08-29 NOTE — Patient Instructions (Signed)

## 2020-09-01 ENCOUNTER — Encounter: Payer: Self-pay | Admitting: Dermatology

## 2020-09-04 ENCOUNTER — Telehealth: Payer: Self-pay

## 2020-09-04 NOTE — Telephone Encounter (Signed)
-----   Message from Ralene Bathe, MD sent at 09/03/2020  4:52 PM EST ----- Diagnosis Skin , right prox dorsum med great toe MELANOMA IN SITU  Cancer - Melanoma in situ Superficial Schedule for surgery Discussed with pt by phone and she is awaiting call from [place] to schedule surgery

## 2020-09-04 NOTE — Telephone Encounter (Signed)
Patient advised of results per Dr. Nehemiah Massed. Patient has been scheduled for surgery on 10/02/20.

## 2020-10-02 ENCOUNTER — Encounter: Payer: BC Managed Care – PPO | Admitting: Dermatology

## 2020-10-16 ENCOUNTER — Telehealth: Payer: Self-pay

## 2020-10-16 ENCOUNTER — Other Ambulatory Visit: Payer: Self-pay

## 2020-10-16 ENCOUNTER — Encounter: Payer: Self-pay | Admitting: Dermatology

## 2020-10-16 ENCOUNTER — Ambulatory Visit: Payer: BC Managed Care – PPO | Admitting: Dermatology

## 2020-10-16 DIAGNOSIS — D0371 Melanoma in situ of right lower limb, including hip: Secondary | ICD-10-CM

## 2020-10-16 MED ORDER — MUPIROCIN 2 % EX OINT: 1.0000 "application " | TOPICAL_OINTMENT | Freq: Every day | CUTANEOUS | 1 refills | Status: DC

## 2020-10-16 NOTE — Patient Instructions (Signed)
If you have any questions or concerns for your doctor, please call our main line at [phone] and press option 4 to reach your doctor's medical assistant. If no one answers, please leave a voicemail as directed and we will return your call as soon as possible. Messages left after 4 pm will be answered the following business day.   You may also send us a message via MyChart. We typically respond to MyChart messages within 1-2 business days.  For prescription refills, please ask your pharmacy to contact our office. Our fax number is [phone].  If you have an urgent issue when the clinic is closed that cannot wait until the next business day, you can page your doctor at the number below.    Please note that while we do our best to be available for urgent issues outside of office hours, we are not available 24/7.   If you have an urgent issue and are unable to reach us, you may choose to seek medical care at your doctor's office, retail clinic, urgent care center, or emergency room.  If you have a medical emergency, please immediately call 911 or go to the emergency department.  Pager Numbers  - Dr. Kowalski: [phone]  - Dr. Moye: [phone]  - Dr. Stewart: [phone]  In the event of inclement weather, please call our main line at [phone] for an update on the status of any delays or closures.  Dermatology Medication Tips: Please keep the boxes that topical medications come in in order to help keep track of the instructions about where and how to use these. Pharmacies typically print the medication instructions only on the boxes and not directly on the medication tubes.   If your medication is too expensive, please contact our office at [phone] option 4 or send us a message through MyChart.   We are unable to tell what your co-pay for medications will be in advance as this is different depending on your insurance coverage. However, we may be able to find a  substitute medication at lower cost or fill out paperwork to get insurance to cover a needed medication.   If a prior authorization is required to get your medication covered by your insurance company, please allow us 1-2 business days to complete this process.  Drug prices often vary depending on where the prescription is filled and some pharmacies may offer cheaper prices.  The website www.goodrx.com contains coupons for medications through different pharmacies. The prices here do not account for what the cost may be with help from insurance (it may be cheaper with your insurance), but the website can give you the price if you did not use any insurance.  - You can print the associated coupon and take it with your prescription to the pharmacy.  - You may also stop by our office during regular business hours and pick up a GoodRx coupon card.  - If you need your prescription sent electronically to a different pharmacy, notify our office through [facility] MyChart or by phone at [phone] option 4.     Wound Care Instructions  1. Cleanse wound gently with soap and water once a day then pat dry with clean gauze. Apply a thing coat of Petrolatum (petroleum jelly, "Vaseline") over the wound (unless you have an allergy to this). We recommend that you use a new, sterile tube of Vaseline. Do not pick or remove scabs. Do not remove the yellow or white "healing tissue" from the base of the wound.    2. Cover the wound with fresh, clean, nonstick gauze and secure with paper tape. You may use Band-Aids in place of gauze and tape if the would is small enough, but would recommend trimming much of the tape off as there is often too much. Sometimes Band-Aids can irritate the skin.  3. You should call the office for your biopsy report after 1 week if you have not already been contacted.  4. If you experience any problems, such as abnormal amounts of bleeding, swelling, significant bruising, significant pain,  or evidence of infection, please call the office immediately.  5. FOR ADULT SURGERY PATIENTS: If you need something for pain relief you may take 1 extra strength Tylenol (acetaminophen) AND 2 Ibuprofen (200mg each) together every 4 hours as needed for pain. (do not take these if you are allergic to them or if you have a reason you should not take them.) Typically, you may only need pain medication for 1 to 3 days.     

## 2020-10-16 NOTE — Progress Notes (Signed)
   Follow-Up Visit   Subjective  Latasha Cannon is a 40 y.o. female who presents for the following: Melanoma IS bx proven (R prox dorsum med great toe, pt presents for excision).  The following portions of the chart were reviewed this encounter and updated as appropriate:   Tobacco  Allergies  Meds  Problems  Med Hx  Surg Hx  Fam Hx     Review of Systems:  No other skin or systemic complaints except as noted in HPI or Assessment and Plan.  Objective  Well appearing patient in no apparent distress; mood and affect are within normal limits.  A focused examination was performed including R foot. Relevant physical exam findings are noted in the Assessment and Plan.  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 (HCC) Right proximal dorsum medial great toe  Skin excision  Lesion length (cm):  1.1 Lesion width (cm):  1.1 Margin per side (cm):  0.4 Total excision diameter (cm):  1.9 Informed consent: discussed and consent obtained   Timeout: patient name, date of birth, surgical site, and procedure verified   Procedure prep:  Patient was prepped and draped in usual sterile fashion Prep type:  Isopropyl alcohol and povidone-iodine Anesthesia: the lesion was anesthetized in a standard fashion   Anesthetic:  1% lidocaine w/ epinephrine 1-100,000 buffered w/ 8.4% NaHCO3 (5.0cc) Instrument used comment:  #15 c blade Hemostasis achieved with: pressure and Gelfoam   Hemostasis achieved with comment:  Electrocautery Outcome: patient tolerated procedure well with no complications   Post-procedure details: sterile dressing applied and wound care instructions given   Dressing type: bandage and pressure dressing (Mupirocin)   Additional details:  2ndary intention healing  mupirocin ointment (BACTROBAN) 2 %  Specimen 1 - Surgical pathology Differential Diagnosis: D03.71 Melanoma  IS  Check Margins: yes Pink bx site 1.1cm [id number]  Bx proven  Discussed diagnosis in detail including significance of melanoma diagnosis which can be potentially lethal.  Discussed treatment recommendations in detail advising that treatment recommendations are based on longitudinal studies and retrospective studies and are nationwide protocols.  Advised there is always potential for recurrence even after definitive treatment.  After definitive treatment, we recommend total-body skin exams every 3 months for a year; then every 4 months for a year; then every 6 months for 3 years.  At 5 years post treatment, if all looks good we would recommend at least yearly total-body skin exams for the rest of your life.  The patient was given time for questions and these were answered.  We recommend frequent self skin examinations; photoprotection with sunscreen, sun protective clothing, hats, sunglasses and sun avoidance.  If the patient notices any new or changing skin lesions the patient should return to the office immediately for evaluation.    Start Mupirocin oint qd to excision site until healed  Return in about 7 years (around 10/17/2027) for suture removal.  I, Othelia Pulling, RMA, am acting as scribe for Sarina Ser, MD .  Documentation: I have reviewed the above documentation for accuracy and completeness, and I agree with the above.  Sarina Ser, MD

## 2020-10-16 NOTE — Telephone Encounter (Signed)
Patient doing fine after today's surgery./sh 

## 2020-10-17 ENCOUNTER — Encounter: Payer: Self-pay | Admitting: Dermatology

## 2020-10-23 ENCOUNTER — Ambulatory Visit (INDEPENDENT_AMBULATORY_CARE_PROVIDER_SITE_OTHER): Payer: BC Managed Care – PPO | Admitting: Dermatology

## 2020-10-23 ENCOUNTER — Other Ambulatory Visit: Payer: Self-pay

## 2020-10-23 ENCOUNTER — Encounter: Payer: Self-pay | Admitting: Dermatology

## 2020-10-23 DIAGNOSIS — D0371 Melanoma in situ of right lower limb, including hip: Secondary | ICD-10-CM

## 2020-10-23 NOTE — Progress Notes (Addendum)
   Follow-Up Visit   Subjective  Latasha Cannon is a 40 y.o. female who presents for the following: Melanoma IS margins free, bx proven (R proximal dorsum medial great toe, pt presents for 1 week f/u post excision, Doxycycline 100mg  1 po bid, Mupirocin oint qd).  The following portions of the chart were reviewed this encounter and updated as appropriate:   Tobacco  Allergies  Meds  Problems  Med Hx  Surg Hx  Fam Hx     Review of Systems:  No other skin or systemic complaints except as noted in HPI or Assessment and Plan.  Objective  Well appearing patient in no apparent distress; mood and affect are within normal limits.  A focused examination was performed including R great toe. Relevant physical exam findings are noted in the Assessment and Plan.  Objective  R proximal dorsum medial great toe: Healing excision site   Assessment & Plan  Melanoma in situ of right lower extremity including hip (HCC) R proximal dorsum medial great toe  Margins free, bx proven - with possible cellulitis infection  - Incision site at the R proximal dorsum medial great toe is clean, dry and intact - Wound cleansed with Puracyn spray and Mupirocin 2% ointment applied to aa. Then covered with a non-stick gauze and wrapped in Coban. - Discussed pathology results showing Melanoma IS margins free  - Scars remodel for a full year. - Patient advised to call with any concerns or if they notice any new or changing lesions.   Finish course of Doxycycline.   Other Related Medications mupirocin ointment (BACTROBAN) 2 %  Return in about 2 weeks (around 11/06/2020) for recheck, and 3 months for TBSE .   I, Othelia Pulling, RMA, am acting as scribe for Sarina Ser, MD .  Documentation: I have reviewed the above documentation for accuracy and completeness, and I agree with the above.  Sarina Ser, MD

## 2020-10-23 NOTE — Patient Instructions (Signed)

## 2020-11-08 ENCOUNTER — Other Ambulatory Visit: Payer: Self-pay

## 2020-11-08 ENCOUNTER — Ambulatory Visit: Payer: BC Managed Care – PPO | Admitting: Dermatology

## 2020-11-08 DIAGNOSIS — Z86006 Personal history of melanoma in-situ: Secondary | ICD-10-CM

## 2020-11-08 DIAGNOSIS — Z85820 Personal history of malignant melanoma of skin: Secondary | ICD-10-CM

## 2020-11-08 NOTE — Progress Notes (Signed)
   Follow-Up Visit   Subjective  Latasha Cannon is a 40 y.o. female who presents for the following: wound recheck (R great toe - MMIS, margins free. Patient is here today to recheck healing wound).  The following portions of the chart were reviewed this encounter and updated as appropriate:   Tobacco  Allergies  Meds  Problems  Med Hx  Surg Hx  Fam Hx     Review of Systems:  No other skin or systemic complaints except as noted in HPI or Assessment and Plan.  Objective  Well appearing patient in no apparent distress; mood and affect are within normal limits.  A focused examination was performed including the R foot . Relevant physical exam findings are noted in the Assessment and Plan.  Objective  R great toe: Healing ulceration    Assessment & Plan  History of melanoma R great toe MMIS - Continue wound care daily and Mupirocin 2% ointment daily. Healing well today no evidence of infection today.  Continue wound care. Return in about 6 weeks (around 12/20/2020). Wound recheck.  Luther Redo, CMA, am acting as scribe for Sarina Ser, MD .  Documentation: I have reviewed the above documentation for accuracy and completeness, and I agree with the above.  Sarina Ser, MD

## 2020-11-08 NOTE — Patient Instructions (Signed)

## 2020-11-11 ENCOUNTER — Encounter: Payer: Self-pay | Admitting: Dermatology

## 2020-12-20 ENCOUNTER — Other Ambulatory Visit: Payer: Self-pay

## 2020-12-20 ENCOUNTER — Ambulatory Visit: Payer: BC Managed Care – PPO | Admitting: Dermatology

## 2020-12-20 DIAGNOSIS — Z86006 Personal history of melanoma in-situ: Secondary | ICD-10-CM

## 2020-12-20 NOTE — Progress Notes (Signed)
   Follow-Up Visit   Subjective  Latasha Cannon is a 40 y.o. female who presents for the following: Follow-up (6 weeks f/u the right great toe Melanoma in situ excised 10-16-20 ).  The following portions of the chart were reviewed this encounter and updated as appropriate:   Tobacco  Allergies  Meds  Problems  Med Hx  Surg Hx  Fam Hx     Review of Systems:  No other skin or systemic complaints except as noted in HPI or Assessment and Plan.  Objective  Well appearing patient in no apparent distress; mood and affect are within normal limits.  A focused examination was performed including right great toe . Relevant physical exam findings are noted in the Assessment and Plan.  Objective  Right great toe: Healing ulcer    Assessment & Plan  History of melanoma in situ Right great toe  MMIS - Continue wound care daily and Mupirocin 2% ointment daily. Healing well today no evidence of infection today.   Continue wound care, recommend debridement daily during band aid changes  Return for as scheduled 02/07/2021 for TBSE, hx of MMIS.  IMarye Round, CMA, am acting as scribe for Sarina Ser, MD .  Documentation: I have reviewed the above documentation for accuracy and completeness, and I agree with the above.  Sarina Ser, MD

## 2020-12-20 NOTE — Patient Instructions (Addendum)

## 2020-12-21 ENCOUNTER — Encounter: Payer: Self-pay | Admitting: Dermatology

## 2020-12-31 ENCOUNTER — Encounter: Payer: Self-pay | Admitting: Dermatology

## 2021-02-07 ENCOUNTER — Other Ambulatory Visit: Payer: Self-pay

## 2021-02-07 ENCOUNTER — Ambulatory Visit: Payer: BC Managed Care – PPO | Admitting: Dermatology

## 2021-02-07 DIAGNOSIS — L814 Other melanin hyperpigmentation: Secondary | ICD-10-CM

## 2021-02-07 DIAGNOSIS — Z86006 Personal history of melanoma in-situ: Secondary | ICD-10-CM | POA: Diagnosis not present

## 2021-02-07 DIAGNOSIS — Z1283 Encounter for screening for malignant neoplasm of skin: Secondary | ICD-10-CM | POA: Diagnosis not present

## 2021-02-07 DIAGNOSIS — Z86018 Personal history of other benign neoplasm: Secondary | ICD-10-CM

## 2021-02-07 DIAGNOSIS — L821 Other seborrheic keratosis: Secondary | ICD-10-CM

## 2021-02-07 DIAGNOSIS — D229 Melanocytic nevi, unspecified: Secondary | ICD-10-CM

## 2021-02-07 DIAGNOSIS — D1800 Hemangioma unspecified site: Secondary | ICD-10-CM

## 2021-02-07 DIAGNOSIS — L578 Other skin changes due to chronic exposure to nonionizing radiation: Secondary | ICD-10-CM

## 2021-02-07 DIAGNOSIS — L858 Other specified epidermal thickening: Secondary | ICD-10-CM

## 2021-02-07 NOTE — Patient Instructions (Signed)

## 2021-02-07 NOTE — Progress Notes (Signed)
   Follow-Up Visit   Subjective  Latasha Cannon is a 40 y.o. female who presents for the following: Total body skin exam (Hx of Melanoma IS R prox dorsum med great toe, hx of Dysplastic Nevus L dorsum middle toe med aspect). The patient presents for Total-Body Skin Exam (TBSE) for skin cancer screening and mole check.  The following portions of the chart were reviewed this encounter and updated as appropriate:   Tobacco  Allergies  Meds  Problems  Med Hx  Surg Hx  Fam Hx     Review of Systems:  No other skin or systemic complaints except as noted in HPI or Assessment and Plan.  Objective  Well appearing patient in no apparent distress; mood and affect are within normal limits.  A full examination was performed including scalp, head, eyes, ears, nose, lips, neck, chest, axillae, abdomen, back, buttocks, bilateral upper extremities, bilateral lower extremities, hands, feet, fingers, toes, fingernails, and toenails. All findings within normal limits unless otherwise noted below.  Right prox dorsum med great toe Scar clear to visual exam and palpation, no lymphadenopathy  L dorsum middle toe medial aspect Scar with no evidence of recurrence.    Assessment & Plan   Lentigines - Scattered tan macules - Due to sun exposure - Benign-appering, observe - Recommend daily broad spectrum sunscreen SPF 30+ to sun-exposed areas, reapply every 2 hours as needed. - Call for any changes  Seborrheic Keratoses - Stuck-on, waxy, tan-brown papules and/or plaques  - Benign-appearing - Discussed benign etiology and prognosis. - Observe - Call for any changes  Melanocytic Nevi - Tan-brown and/or pink-flesh-colored symmetric macules and papules - Benign appearing on exam today - Observation - Call clinic for new or changing moles - Recommend daily use of broad spectrum spf 30+ sunscreen to sun-exposed areas.   Hemangiomas - Red papules - Discussed benign nature - Observe - Call for any  changes  Actinic Damage - Chronic condition, secondary to cumulative UV/sun exposure - diffuse scaly erythematous macules with underlying dyspigmentation - Recommend daily broad spectrum sunscreen SPF 30+ to sun-exposed areas, reapply every 2 hours as needed.  - Staying in the shade or wearing long sleeves, sun glasses (UVA+UVB protection) and wide brim hats (4-inch brim around the entire circumference of the hat) are also recommended for sun protection.  - Call for new or changing lesions.  Skin cancer screening performed today.  Keratosis Pilaris - Tiny follicular keratotic papules - Benign. Genetic in nature. No cure. - Observe. - If desired, patient can use an emollient (moisturizer) containing ammonium lactate, urea or salicylic acid once a day to smooth the area  History of melanoma in situ Right prox dorsum med great toe Excised 10/16/20 Clear. Observe for recurrence.  No lymphadenopathy.  Call clinic for new or changing lesions.  Recommend regular skin exams, daily broad-spectrum spf 30+ sunscreen use, and photoprotection.    Avoid shoes that rub on scar  History of dysplastic nevus L dorsum middle toe medial aspect Clear. Observe for recurrence. Call clinic for new or changing lesions.  Recommend regular skin exams, daily broad-spectrum spf 30+ sunscreen use, and photoprotection.    Return in about 3 months (around 05/10/2021) for TBSE, Hx of Melanoma IS, Hx of Dysplastic nevi.  I, Othelia Pulling, RMA, am acting as scribe for Sarina Ser, MD . Documentation: I have reviewed the above documentation for accuracy and completeness, and I agree with the above.  Sarina Ser, MD

## 2021-02-09 ENCOUNTER — Encounter: Payer: Self-pay | Admitting: Dermatology

## 2021-02-12 ENCOUNTER — Encounter: Payer: Self-pay | Admitting: Family Medicine

## 2021-02-13 ENCOUNTER — Other Ambulatory Visit: Payer: Self-pay

## 2021-02-13 MED ORDER — CITALOPRAM HYDROBROMIDE 20 MG PO TABS: 20.0000 mg | ORAL_TABLET | Freq: Every day | ORAL | 0 refills | Status: DC

## 2021-03-05 ENCOUNTER — Encounter: Payer: Self-pay | Admitting: Family Medicine

## 2021-03-05 DIAGNOSIS — E785 Hyperlipidemia, unspecified: Secondary | ICD-10-CM

## 2021-03-05 DIAGNOSIS — E559 Vitamin D deficiency, unspecified: Secondary | ICD-10-CM

## 2021-03-06 NOTE — Telephone Encounter (Signed)
Ok to order CBC, CMP, lipid, Vit D. Thanks

## 2021-03-12 LAB — CBC WITH DIFFERENTIAL/PLATELET
Basophils Absolute: 0 10*3/uL (ref 0.0–0.2)
Basos: 1 %
EOS (ABSOLUTE): 0.1 10*3/uL (ref 0.0–0.4)
Eos: 1 %
Hematocrit: 40.3 % (ref 34.0–46.6)
Hemoglobin: 13.4 g/dL (ref 11.1–15.9)
Immature Grans (Abs): 0 10*3/uL (ref 0.0–0.1)
Immature Granulocytes: 0 %
Lymphocytes Absolute: 2.1 10*3/uL (ref 0.7–3.1)
Lymphs: 33 %
MCH: 29.1 pg (ref 26.6–33.0)
MCHC: 33.3 g/dL (ref 31.5–35.7)
MCV: 87 fL (ref 79–97)
Monocytes Absolute: 0.4 10*3/uL (ref 0.1–0.9)
Monocytes: 6 %
Neutrophils Absolute: 3.7 10*3/uL (ref 1.4–7.0)
Neutrophils: 59 %
Platelets: 313 10*3/uL (ref 150–450)
RBC: 4.61 x10E6/uL (ref 3.77–5.28)
RDW: 12.6 % (ref 11.7–15.4)
WBC: 6.2 10*3/uL (ref 3.4–10.8)

## 2021-03-12 LAB — COMPREHENSIVE METABOLIC PANEL
ALT: 16 IU/L (ref 0–32)
AST: 16 IU/L (ref 0–40)
Albumin/Globulin Ratio: 2.2 (ref 1.2–2.2)
Albumin: 4.6 g/dL (ref 3.8–4.8)
Alkaline Phosphatase: 63 IU/L (ref 44–121)
BUN/Creatinine Ratio: 14 (ref 9–23)
BUN: 11 mg/dL (ref 6–24)
Bilirubin Total: <0.2 mg/dL (ref 0.0–1.2)
CO2: 19 mmol/L — ABNORMAL LOW (ref 20–29)
Calcium: 9.3 mg/dL (ref 8.7–10.2)
Chloride: 105 mmol/L (ref 96–106)
Creatinine, Ser: 0.8 mg/dL (ref 0.57–1.00)
Globulin, Total: 2.1 g/dL (ref 1.5–4.5)
Glucose: 102 mg/dL — ABNORMAL HIGH (ref 65–99)
Potassium: 4.8 mmol/L (ref 3.5–5.2)
Sodium: 138 mmol/L (ref 134–144)
Total Protein: 6.7 g/dL (ref 6.0–8.5)
eGFR: 95 mL/min/{1.73_m2} (ref >59)

## 2021-03-12 LAB — LIPID PANEL WITH LDL/HDL RATIO
Cholesterol, Total: 161 mg/dL (ref 100–199)
HDL: 43 mg/dL (ref >39)
LDL Chol Calc (NIH): 98 mg/dL (ref 0–99)
LDL/HDL Ratio: 2.3 ratio (ref 0.0–3.2)
Triglycerides: 109 mg/dL (ref 0–149)
VLDL Cholesterol Cal: 20 mg/dL (ref 5–40)

## 2021-03-12 LAB — VITAMIN D 25 HYDROXY (VIT D DEFICIENCY, FRACTURES): Vit D, 25-Hydroxy: 35.3 ng/mL (ref 30.0–100.0)

## 2021-03-13 ENCOUNTER — Encounter: Payer: BC Managed Care – PPO | Admitting: Family Medicine

## 2021-03-18 ENCOUNTER — Other Ambulatory Visit: Payer: Self-pay

## 2021-03-18 ENCOUNTER — Ambulatory Visit (INDEPENDENT_AMBULATORY_CARE_PROVIDER_SITE_OTHER): Payer: BC Managed Care – PPO | Admitting: Family Medicine

## 2021-03-18 ENCOUNTER — Encounter: Payer: Self-pay | Admitting: Family Medicine

## 2021-03-18 VITALS — BP 102/72 | HR 97 | Temp 98.9°F | Resp 16 | Ht 64.0 in | Wt 145.9 lb

## 2021-03-18 DIAGNOSIS — F419 Anxiety disorder, unspecified: Secondary | ICD-10-CM

## 2021-03-18 DIAGNOSIS — J302 Other seasonal allergic rhinitis: Secondary | ICD-10-CM | POA: Diagnosis not present

## 2021-03-18 DIAGNOSIS — Z1231 Encounter for screening mammogram for malignant neoplasm of breast: Secondary | ICD-10-CM

## 2021-03-18 DIAGNOSIS — R739 Hyperglycemia, unspecified: Secondary | ICD-10-CM | POA: Diagnosis not present

## 2021-03-18 DIAGNOSIS — Z0000 Encounter for general adult medical examination without abnormal findings: Secondary | ICD-10-CM | POA: Diagnosis not present

## 2021-03-18 DIAGNOSIS — C73 Malignant neoplasm of thyroid gland: Secondary | ICD-10-CM

## 2021-03-18 DIAGNOSIS — E559 Vitamin D deficiency, unspecified: Secondary | ICD-10-CM | POA: Diagnosis not present

## 2021-03-18 DIAGNOSIS — E890 Postprocedural hypothyroidism: Secondary | ICD-10-CM

## 2021-03-18 LAB — POCT GLYCOSYLATED HEMOGLOBIN (HGB A1C)
Est. average glucose Bld gHb Est-mCnc: 108
Hemoglobin A1C: 5.3 % (ref 4.0–5.6)

## 2021-03-18 MED ORDER — CITALOPRAM HYDROBROMIDE 20 MG PO TABS: 20.0000 mg | ORAL_TABLET | Freq: Every day | ORAL | 3 refills | Status: DC

## 2021-03-18 NOTE — Assessment & Plan Note (Signed)
-   Chronic, stable on 8/22 labs - Continue vitamin D supplement

## 2021-03-18 NOTE — Assessment & Plan Note (Signed)
-   Stable and well-controlled - Continue Citalopram

## 2021-03-18 NOTE — Assessment & Plan Note (Signed)
-   Chronic, poorly controlled - Counseled pt. to use Flonase & Mucinex prn for rhinorrhea, congestion, & sinus pain

## 2021-03-18 NOTE — Progress Notes (Signed)
Annual Wellness Visit     Patient: Latasha Cannon, Female    DOB: 17-Aug-1980, 40 y.o.   MRN: [id number] Visit Date: 03/18/2021  Today's Provider: Lavon Paganini, MD   Chief Complaint  Patient presents with   Annual Exam   Subjective    Latasha Cannon is a 40 y.o. female who presents today for her Annual Wellness Visit. She reports consuming a general diet. Home exercise routine includes walking. She generally feels well. She reports sleeping well. She does not have additional problems to discuss today.   HPI Post-surgical Hypothyroidism - Currently taking Synthroid; following with endocrinology - Pt. endorses chronic, intermittent fatigue - Denies cold intolerance & constipation  Vitamin D Deficiency - Currently taking Vit D supplement - Pt. endorses chronic, intermittent fatigue  Anxiety - Denies recent exacerbations of anxiety - Pt. requests RF of Citalopram  Allergic Rhinitis - Pt. reports recent environmental allergies, sinus pain, and rhinorrhea - She uses Mucinex prn, which provides relief - Pt. questions if it's safe to use Flonase + Mucinex together prn for allergies  Health Maintenance - UTD on pap smear & mammo - Pt. requests referral for annual screening mammo in Dec 22   Medications: Outpatient Medications Prior to Visit  Medication Sig   Cholecalciferol 25 MCG (1000 UT) tablet Take 2,000 Units by mouth daily.   levothyroxine (SYNTHROID) 100 MCG tablet Take 100 mcg by mouth daily before breakfast.   loratadine (CLARITIN) 10 MG tablet Take 10 mg by mouth.   naproxen sodium (ANAPROX) 220 MG tablet Take 220 mg by mouth as needed.   SUMAtriptan (IMITREX) 50 MG tablet Take 1 tablet (50 mg total) by mouth every 2 (two) hours as needed for migraine. May repeat in 2 hours if headache persists or recurs.   [DISCONTINUED] citalopram (CELEXA) 20 MG tablet Take 1 tablet (20 mg total) by mouth daily.   [DISCONTINUED] levothyroxine (SYNTHROID, LEVOTHROID) 88  MCG tablet Take by mouth.   [DISCONTINUED] mupirocin ointment (BACTROBAN) 2 % Apply 1 application topically daily. Qd to excision site   No facility-administered medications prior to visit.    Allergies  Allergen Reactions   Biaxin [Clarithromycin]     Vision problems    Buspirone Other (See Comments)    tremors   Escitalopram     Eye Problems   Prednisone    Amoxicillin Rash   Ceclor [Cefaclor] Rash   Codeine Rash    Patient Care Team: Virginia Crews, MD as PCP - General (Family Medicine) Caryn Section Kirstie Peri, MD as Referring Physician (Family Medicine) Christene Lye, MD (General Surgery)  Review of Systems  Constitutional:  Positive for fatigue. Negative for activity change, appetite change and fever.  HENT:  Positive for rhinorrhea and sinus pain.   Eyes: Negative.   Respiratory: Negative.  Negative for cough, chest tightness and shortness of breath.   Cardiovascular: Negative.  Negative for chest pain and leg swelling.  Gastrointestinal: Negative.   Endocrine: Negative.  Negative for cold intolerance.  Genitourinary: Negative.   Musculoskeletal: Negative.   Skin: Negative.   Allergic/Immunologic: Positive for environmental allergies.  Neurological: Negative.   Psychiatric/Behavioral: Negative.          Objective    Vitals: BP 102/72 (BP Location: Left Arm, Patient Position: Sitting, Cuff Size: Large)   Pulse 97   Temp 98.9 F (37.2 C) (Oral)   Resp 16   Ht '5\' 4"'  (1.626 m)   Wt 145 lb 14.4 oz (66.2 kg)  BMI 25.04 kg/m     Physical Exam Constitutional:      General: She is not in acute distress.    Appearance: Normal appearance. She is normal weight.  HENT:     Head: Normocephalic and atraumatic.     Right Ear: External ear normal.     Left Ear: External ear normal.     Nose: Nose normal.  Eyes:     Conjunctiva/sclera: Conjunctivae normal.  Cardiovascular:     Rate and Rhythm: Normal rate and regular rhythm.     Pulses: Normal pulses.      Heart sounds: Normal heart sounds.  Pulmonary:     Effort: Pulmonary effort is normal.     Breath sounds: Normal breath sounds.  Chest:     Chest wall: No mass, deformity or tenderness.  Breasts:    Breasts are symmetrical.     Right: Normal. No mass.     Left: Normal. No mass.  Abdominal:     General: Abdomen is flat. Bowel sounds are normal.     Palpations: Abdomen is soft.  Musculoskeletal:        General: No swelling or deformity.  Lymphadenopathy:     Upper Body:     Right upper body: No axillary or pectoral adenopathy.     Left upper body: No axillary or pectoral adenopathy.  Skin:    General: Skin is warm and dry.  Neurological:     Mental Status: She is alert.  Psychiatric:        Behavior: Behavior normal.        Thought Content: Thought content normal.    Most recent functional status assessment: In your present state of health, do you have any difficulty performing the following activities: 03/18/2021  Hearing? N  Vision? N  Difficulty concentrating or making decisions? N  Walking or climbing stairs? N  Dressing or bathing? N  Doing errands, shopping? N  Some recent data might be hidden   Most recent fall risk assessment: Fall Risk  03/18/2021  Falls in the past year? 0  Number falls in past yr: 0  Injury with Fall? 0  Risk for fall due to : No Fall Risks  Follow up Falls evaluation completed    Most recent depression screenings: PHQ 2/9 Scores 03/18/2021 03/07/2020  PHQ - 2 Score 2 2  PHQ- 9 Score 4 5   Most recent cognitive screening: No flowsheet data found. Most recent Audit-C alcohol use screening Alcohol Use Disorder Test (AUDIT) 03/18/2021  1. How often do you have a drink containing alcohol? 1  2. How many drinks containing alcohol do you have on a typical day when you are drinking? 0  3. How often do you have six or more drinks on one occasion? 0  AUDIT-C Score 1  Alcohol Brief Interventions/Follow-up -   A score of 3 or more in women,  and 4 or more in men indicates increased risk for alcohol abuse, EXCEPT if all of the points are from question 1   No results found for any visits on 03/18/21.  Assessment & Plan     Annual wellness visit done today including the all of the following: Reviewed patient's Family Medical History Reviewed and updated list of patient's medical providers Assessment of cognitive impairment was done Assessed patient's functional ability Established a written schedule for health screening [place] Completed and Reviewed  Exercise Activities and Dietary recommendations  Goals   None  Immunization History  Administered Date(s) Administered   Influenza,inj,Quad PF,6+ Mos 04/20/2019, 04/11/2020   Influenza-Unspecified 04/20/2018   MMR 06/20/1996   PFIZER(Purple Top)SARS-COV-2 Vaccination 09/30/2019, 10/26/2019, 06/22/2020   Tdap 02/22/2015   Varicella 06/20/1996, 07/25/1996    Health Maintenance  Topic Date Due   Pneumococcal Vaccine 74-26 Years old (1 - PCV) Never done   COVID-19 Vaccine (4 - Booster for Pfizer series) 09/20/2020   INFLUENZA VACCINE  02/18/2021   PAP SMEAR-Modifier  02/25/2023   TETANUS/TDAP  02/21/2025   Hepatitis C Screening  Completed   HIV Screening  Completed   HPV VACCINES  Aged Out     Discussed health benefits of physical activity, and encouraged her to engage in regular exercise appropriate for her age and condition.    Problem List Items Addressed This Visit       Respiratory   Allergic rhinitis    - Chronic, poorly controlled - Counseled pt. to use Flonase & Mucinex prn for rhinorrhea, congestion, & sinus pain        Endocrine   Papillary thyroid carcinoma (HCC)   - S/p thyroidectomy - Continue Synthroid - Continue following with endocrinology    Post-surgical hypothyroidism    - Chronic and stable - 8/22 labs WNL - Continue Synthroid - Continue following with endocrinology        Other   Anxiety    - Stable  and well-controlled - Continue Citalopram      Relevant Medications   citalopram (CELEXA) 20 MG tablet   Avitaminosis D    - Chronic, stable on 8/22 labs - Continue vitamin D supplement      Other Visit Diagnoses     Encounter for annual physical exam    -  Primary   - Overall doing well - Recommended flu shot & COVID booster in the fall - Return for annual physical in 1 year    Encounter for screening mammogram for malignant neoplasm of breast       Relevant Orders   MM 3D SCREEN BREAST BILATERAL   Hyperglycemia       - Acute episode of hyperglycemia noted on CMP on 8/22 - Check A1c    Relevant Orders   POCT HgB A1C        Return in about 1 year (around 03/18/2022) for CPE.      Percell Locus, MS3   Patient seen along with MS3 student Percell Locus. I personally evaluated this patient along with the student, and verified all aspects of the history, physical exam, and medical decision making as documented by the student. I agree with the student's documentation and have made all necessary edits.  Jemarcus Dougal, Dionne Bucy, MD, MPH [facility]

## 2021-03-18 NOTE — Assessment & Plan Note (Signed)
-   Chronic and stable - 8/22 labs WNL - Continue Synthroid - Continue following with endocrinology

## 2021-03-19 ENCOUNTER — Encounter: Payer: BC Managed Care – PPO | Admitting: Family Medicine

## 2021-04-24 ENCOUNTER — Ambulatory Visit (INDEPENDENT_AMBULATORY_CARE_PROVIDER_SITE_OTHER): Payer: BC Managed Care – PPO

## 2021-04-24 ENCOUNTER — Other Ambulatory Visit: Payer: Self-pay

## 2021-04-24 DIAGNOSIS — Z23 Encounter for immunization: Secondary | ICD-10-CM | POA: Diagnosis not present

## 2021-05-08 ENCOUNTER — Ambulatory Visit: Payer: BC Managed Care – PPO | Admitting: Dermatology

## 2021-05-08 ENCOUNTER — Other Ambulatory Visit: Payer: Self-pay

## 2021-05-08 ENCOUNTER — Encounter: Payer: Self-pay | Admitting: Dermatology

## 2021-05-08 DIAGNOSIS — L821 Other seborrheic keratosis: Secondary | ICD-10-CM

## 2021-05-08 DIAGNOSIS — D239 Other benign neoplasm of skin, unspecified: Secondary | ICD-10-CM

## 2021-05-08 DIAGNOSIS — Z1283 Encounter for screening for malignant neoplasm of skin: Secondary | ICD-10-CM | POA: Diagnosis not present

## 2021-05-08 DIAGNOSIS — Z86018 Personal history of other benign neoplasm: Secondary | ICD-10-CM

## 2021-05-08 DIAGNOSIS — D229 Melanocytic nevi, unspecified: Secondary | ICD-10-CM

## 2021-05-08 DIAGNOSIS — Z86006 Personal history of melanoma in-situ: Secondary | ICD-10-CM

## 2021-05-08 DIAGNOSIS — L578 Other skin changes due to chronic exposure to nonionizing radiation: Secondary | ICD-10-CM | POA: Diagnosis not present

## 2021-05-08 DIAGNOSIS — D2272 Melanocytic nevi of left lower limb, including hip: Secondary | ICD-10-CM | POA: Diagnosis not present

## 2021-05-08 DIAGNOSIS — D489 Neoplasm of uncertain behavior, unspecified: Secondary | ICD-10-CM

## 2021-05-08 DIAGNOSIS — L814 Other melanin hyperpigmentation: Secondary | ICD-10-CM

## 2021-05-08 DIAGNOSIS — D1801 Hemangioma of skin and subcutaneous tissue: Secondary | ICD-10-CM

## 2021-05-08 HISTORY — DX: Other benign neoplasm of skin, unspecified: D23.9

## 2021-05-08 NOTE — Progress Notes (Signed)
Follow-Up Visit   Subjective  Latasha Cannon is a 40 y.o. female who presents for the following: Annual Exam (Mole check ). Hx of Melanoma in situ  The patient presents for Total-Body Skin Exam (TBSE) for skin cancer screening and mole check.   The following portions of the chart were reviewed this encounter and updated as appropriate:   Tobacco  Allergies  Meds  Problems  Med Hx  Surg Hx  Fam Hx     Review of Systems:  No other skin or systemic complaints except as noted in HPI or Assessment and Plan.  Objective  Well appearing patient in no apparent distress; mood and affect are within normal limits.  A full examination was performed including scalp, head, eyes, ears, nose, lips, neck, chest, axillae, abdomen, back, buttocks, bilateral upper extremities, bilateral lower extremities, hands, feet, fingers, toes, fingernails, and toenails. All findings within normal limits unless otherwise noted below.  left sup lateral calf 0.6 cm med brown papule         Assessment & Plan  Neoplasm of uncertain behavior left sup lateral calf  Epidermal / dermal shaving  Lesion diameter (cm):  0.6 Informed consent: discussed and consent obtained   Timeout: patient name, date of birth, surgical site, and procedure verified   Procedure prep:  Patient was prepped and draped in usual sterile fashion Prep type:  Isopropyl alcohol Anesthesia: the lesion was anesthetized in a standard fashion   Anesthetic:  1% lidocaine w/ epinephrine 1-100,000 buffered w/ 8.4% NaHCO3 Hemostasis achieved with: pressure, aluminum chloride and electrodesiccation   Outcome: patient tolerated procedure well   Post-procedure details: sterile dressing applied and wound care instructions given   Dressing type: bandage and petrolatum    Specimen 1 - Surgical pathology Differential Diagnosis: R/O Dysplastic nevus vs other  Check Margins: No  Skin cancer screening  Lentigines - Scattered tan macules - Due  to sun exposure - Benign-appearing, observe - Recommend daily broad spectrum sunscreen SPF 30+ to sun-exposed areas, reapply every 2 hours as needed. - Call for any changes  Seborrheic Keratoses - Stuck-on, waxy, tan-brown papules and/or plaques  - Benign-appearing - Discussed benign etiology and prognosis. - Observe - Call for any changes  Melanocytic Nevi - Tan-brown and/or pink-flesh-colored symmetric macules and papules - Benign appearing on exam today - Observation - Call clinic for new or changing moles - Recommend daily use of broad spectrum spf 30+ sunscreen to sun-exposed areas.   Hemangiomas - Red papules - Discussed benign nature - Observe - Call for any changes  Actinic Damage - Chronic condition, secondary to cumulative UV/sun exposure - diffuse scaly erythematous macules with underlying dyspigmentation - Recommend daily broad spectrum sunscreen SPF 30+ to sun-exposed areas, reapply every 2 hours as needed.  - Staying in the shade or wearing long sleeves, sun glasses (UVA+UVB protection) and wide brim hats (4-inch brim around the entire circumference of the hat) are also recommended for sun protection.  - Call for new or changing lesions.  History of Dysplastic Nevi Left dorsum middle toe med aspect 2016 - No evidence of recurrence today - Recommend regular full body skin exams - Recommend daily broad spectrum sunscreen SPF 30+ to sun-exposed areas, reapply every 2 hours as needed.  - Call if any new or changing lesions are noted between office visits   History of Melanoma in situ  Right prox dorsum med great toe 10/16/2020 - No evidence of recurrence today - No lymphadenopathy - Recommend regular full  body skin exams - Recommend daily broad spectrum sunscreen SPF 30+ to sun-exposed areas, reapply every 2 hours as needed.  - Call if any new or changing lesions are noted between office visits   Skin cancer screening performed today.   Return in about 3  months (around 08/08/2021) for TBSE, hx of Melanoma in situ.  IMarye Round, CMA, am acting as scribe for Sarina Ser, MD .  Documentation: I have reviewed the above documentation for accuracy and completeness, and I agree with the above.  Sarina Ser, MD

## 2021-05-08 NOTE — Patient Instructions (Addendum)
Wound Care Instructions  Cleanse wound gently with soap and water once a day then pat dry with clean gauze. Apply a thing coat of Petrolatum (petroleum jelly, "Vaseline") over the wound (unless you have an allergy to this). We recommend that you use a new, sterile tube of Vaseline. Do not pick or remove scabs. Do not remove the yellow or white "healing tissue" from the base of the wound.  Cover the wound with fresh, clean, nonstick gauze and secure with paper tape. You may use Band-Aids in place of gauze and tape if the would is small enough, but would recommend trimming much of the tape off as there is often too much. Sometimes Band-Aids can irritate the skin.  You should call the office for your biopsy report after 1 week if you have not already been contacted.  If you experience any problems, such as abnormal amounts of bleeding, swelling, significant bruising, significant pain, or evidence of infection, please call the office immediately.  FOR ADULT SURGERY PATIENTS: If you need something for pain relief you may take 1 extra strength Tylenol (acetaminophen) AND 2 Ibuprofen (200mg each) together every 4 hours as needed for pain. (do not take these if you are allergic to them or if you have a reason you should not take them.) Typically, you may only need pain medication for 1 to 3 days.   If you have any questions or concerns for your doctor, please call our main line at [phone] and press option 4 to reach your doctor's medical assistant. If no one answers, please leave a voicemail as directed and we will return your call as soon as possible. Messages left after 4 pm will be answered the following business day.   You may also send us a message via MyChart. We typically respond to MyChart messages within 1-2 business days.  For prescription refills, please ask your pharmacy to contact our office. Our fax number is [phone].  If you have an urgent issue when the clinic is closed that  cannot wait until the next business day, you can page your doctor at the number below.    Please note that while we do our best to be available for urgent issues outside of office hours, we are not available 24/7.   If you have an urgent issue and are unable to reach us, you may choose to seek medical care at your doctor's office, retail clinic, urgent care center, or emergency room.  If you have a medical emergency, please immediately call 911 or go to the emergency department.  Pager Numbers  - Dr. Kowalski: [phone]  - Dr. Moye: [phone]  - Dr. Stewart: [phone]  In the event of inclement weather, please call our main line at [phone] for an update on the status of any delays or closures.  Dermatology Medication Tips: Please keep the boxes that topical medications come in in order to help keep track of the instructions about where and how to use these. Pharmacies typically print the medication instructions only on the boxes and not directly on the medication tubes.   If your medication is too expensive, please contact our office at [phone] option 4 or send us a message through MyChart.   We are unable to tell what your co-pay for medications will be in advance as this is different depending on your insurance coverage. However, we may be able to find a substitute medication at lower cost or fill out paperwork to get insurance to cover a needed   medication.   If a prior authorization is required to get your medication covered by your insurance company, please allow us 1-2 business days to complete this process.  Drug prices often vary depending on where the prescription is filled and some pharmacies may offer cheaper prices.  The website www.goodrx.com contains coupons for medications through different pharmacies. The prices here do not account for what the cost may be with help from insurance (it may be cheaper with your insurance), but the website can give you the  price if you did not use any insurance.  - You can print the associated coupon and take it with your prescription to the pharmacy.  - You may also stop by our office during regular business hours and pick up a GoodRx coupon card.  - If you need your prescription sent electronically to a different pharmacy, notify our office through [facility] MyChart or by phone at [phone] option 4.   

## 2021-05-13 ENCOUNTER — Telehealth: Payer: Self-pay

## 2021-05-13 NOTE — Telephone Encounter (Signed)
Discussed biopsy results with pt  °

## 2021-05-13 NOTE — Telephone Encounter (Signed)
-----   Message from Ralene Bathe, MD sent at 05/09/2021  5:59 PM EDT ----- Diagnosis Skin , left sup lateral calf DYSPLASTIC COMPOUND NEVUS WITH MILD ATYPIA, DEEP MARGIN INVOLVED  Mild dysplastic Recheck next visit

## 2021-05-13 NOTE — Telephone Encounter (Signed)
Left message on voicemail to return my call.  

## 2021-05-15 ENCOUNTER — Telehealth: Payer: Self-pay

## 2021-05-15 NOTE — Telephone Encounter (Signed)
-----   Message from Ralene Bathe, MD sent at 05/09/2021  5:59 PM EDT ----- Diagnosis Skin , left sup lateral calf DYSPLASTIC COMPOUND NEVUS WITH MILD ATYPIA, DEEP MARGIN INVOLVED  Mild dysplastic Recheck next visit

## 2021-05-15 NOTE — Telephone Encounter (Signed)
Advised pt of bx results/sh ?

## 2021-07-11 ENCOUNTER — Other Ambulatory Visit: Payer: Self-pay

## 2021-07-11 ENCOUNTER — Ambulatory Visit
Admission: RE | Admit: 2021-07-11 | Discharge: 2021-07-11 | Disposition: A | Discharge status: Home / Self Care | Payer: BC Managed Care – PPO | Source: Ambulatory Visit | Attending: Family Medicine | Admitting: Family Medicine

## 2021-07-11 DIAGNOSIS — Z1231 Encounter for screening mammogram for malignant neoplasm of breast: Secondary | ICD-10-CM | POA: Diagnosis present

## 2021-08-14 ENCOUNTER — Other Ambulatory Visit: Payer: Self-pay

## 2021-08-14 ENCOUNTER — Ambulatory Visit: Payer: BC Managed Care – PPO | Admitting: Dermatology

## 2021-08-14 DIAGNOSIS — L814 Other melanin hyperpigmentation: Secondary | ICD-10-CM

## 2021-08-14 DIAGNOSIS — Z86006 Personal history of melanoma in-situ: Secondary | ICD-10-CM | POA: Diagnosis not present

## 2021-08-14 DIAGNOSIS — D229 Melanocytic nevi, unspecified: Secondary | ICD-10-CM | POA: Diagnosis not present

## 2021-08-14 DIAGNOSIS — D1800 Hemangioma unspecified site: Secondary | ICD-10-CM

## 2021-08-14 DIAGNOSIS — L821 Other seborrheic keratosis: Secondary | ICD-10-CM

## 2021-08-14 DIAGNOSIS — Z1283 Encounter for screening for malignant neoplasm of skin: Secondary | ICD-10-CM | POA: Diagnosis not present

## 2021-08-14 DIAGNOSIS — Z86018 Personal history of other benign neoplasm: Secondary | ICD-10-CM

## 2021-08-14 DIAGNOSIS — L578 Other skin changes due to chronic exposure to nonionizing radiation: Secondary | ICD-10-CM | POA: Diagnosis not present

## 2021-08-14 NOTE — Patient Instructions (Signed)

## 2021-08-14 NOTE — Progress Notes (Signed)
Follow-Up Visit   Subjective  Latasha Cannon is a 41 y.o. female who presents for the following: Annual Exam (Mole check ). 3 months f/u melanoma in situ removed from the right great toe 10/16/2020.  The patient presents for Total-Body Skin Exam (TBSE) for skin cancer screening and mole check.  The patient has spots, moles and lesions to be evaluated, some may be new or changing and the patient has concerns that these could be cancer.   The following portions of the chart were reviewed this encounter and updated as appropriate:   Tobacco   Allergies   Meds   Problems   Med Hx   Surg Hx   Fam Hx      Review of Systems:  No other skin or systemic complaints except as noted in HPI or Assessment and Plan.  Objective  Well appearing patient in no apparent distress; mood and affect are within normal limits.  A full examination was performed including scalp, head, eyes, ears, nose, lips, neck, chest, axillae, abdomen, back, buttocks, bilateral upper extremities, bilateral lower extremities, hands, feet, fingers, toes, fingernails, and toenails. All findings within normal limits unless otherwise noted below.  trunk, exts Tan-brown and/or pink-flesh-colored symmetric macules and papules.    Assessment & Plan   Skin cancer screening  Lentigines - Scattered tan macules - Due to sun exposure - Benign-appearing, observe - Recommend daily broad spectrum sunscreen SPF 30+ to sun-exposed areas, reapply every 2 hours as needed. - Call for any changes  Seborrheic Keratoses - Stuck-on, waxy, tan-brown papules and/or plaques  - Benign-appearing - Discussed benign etiology and prognosis. - Observe - Call for any changes  Melanocytic Nevi - Tan-brown and/or pink-flesh-colored symmetric macules and papules - Benign appearing on exam today - Observation - Call clinic for new or changing moles - Recommend daily use of broad spectrum spf 30+ sunscreen to sun-exposed areas.   Hemangiomas - Red  papules - Discussed benign nature - Observe - Call for any changes  Actinic Damage - Chronic condition, secondary to cumulative UV/sun exposure - diffuse scaly erythematous macules with underlying dyspigmentation - Recommend daily broad spectrum sunscreen SPF 30+ to sun-exposed areas, reapply every 2 hours as needed.  - Staying in the shade or wearing long sleeves, sun glasses (UVA+UVB protection) and wide brim hats (4-inch brim around the entire circumference of the hat) are also recommended for sun protection.  - Call for new or changing lesions.  History of Dysplastic Nevi Left dorsum middle toe med aspect 2016-mod to severe Left sup lat calf 05/08/2021-mild atypia  - No evidence of recurrence today - Recommend regular full body skin exams - Recommend daily broad spectrum sunscreen SPF 30+ to sun-exposed areas, reapply every 2 hours as needed.  - Call if any new or changing lesions are noted between office visits   History of Melanoma in Situ Right prox dorsum med great toe 10/16/2020 - No evidence of recurrence today -No lymphadenopathy. - Recommend regular full body skin exams - Recommend daily broad spectrum sunscreen SPF 30+ to sun-exposed areas, reapply every 2 hours as needed.  - Call if any new or changing lesions are noted between office visits   Skin cancer screening performed today.   Return in about 3 months (around 11/12/2021) for TBSE, Hx of Melanoma.  IMarye Round, CMA, am acting as scribe for Sarina Ser, MD .  Documentation: I have reviewed the above documentation for accuracy and completeness, and I agree with the above.  Sarina Ser, MD

## 2021-08-18 ENCOUNTER — Encounter: Payer: Self-pay | Admitting: Dermatology

## 2021-09-04 ENCOUNTER — Encounter: Payer: BC Managed Care – PPO | Admitting: Dermatology

## 2021-09-22 IMAGING — MG DIGITAL SCREENING BILAT W/ TOMO W/ CAD
6 of 10 series · 6 of 30 positions shown · non-contrast
Comparison: None.

CLINICAL DATA: Screening.

EXAM:
DIGITAL SCREENING BILATERAL MAMMOGRAM WITH TOMO AND CAD

[R CC synth-2D]
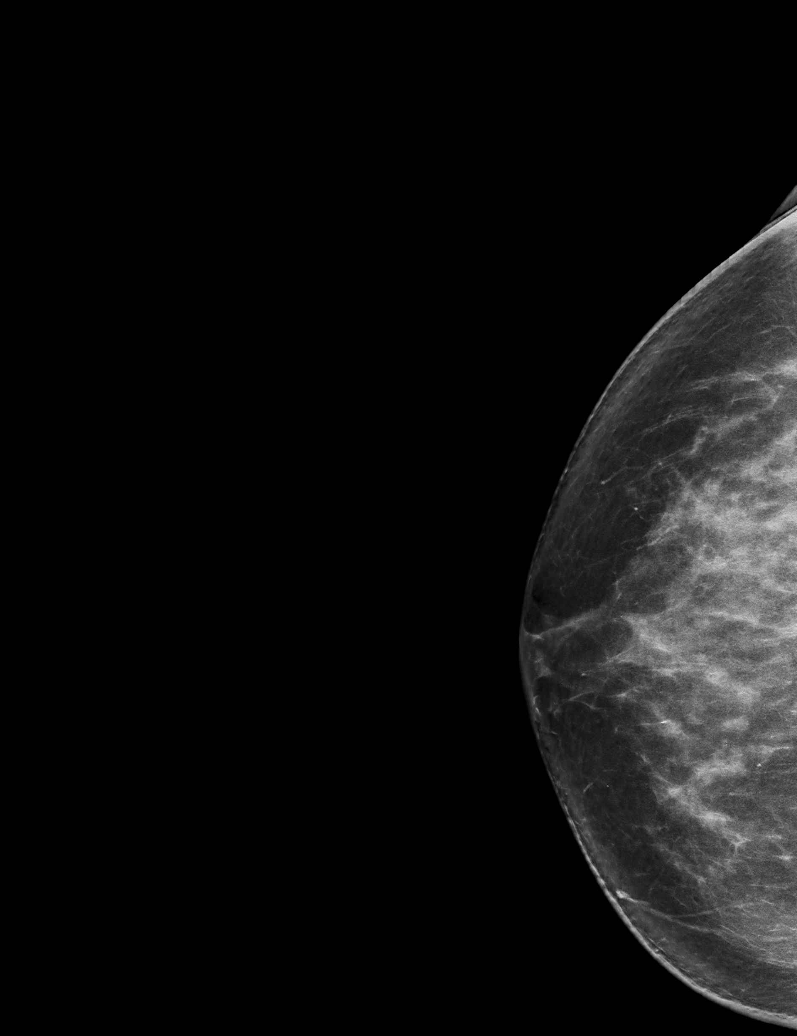

[R MLO synth-2D (1 of 2)]
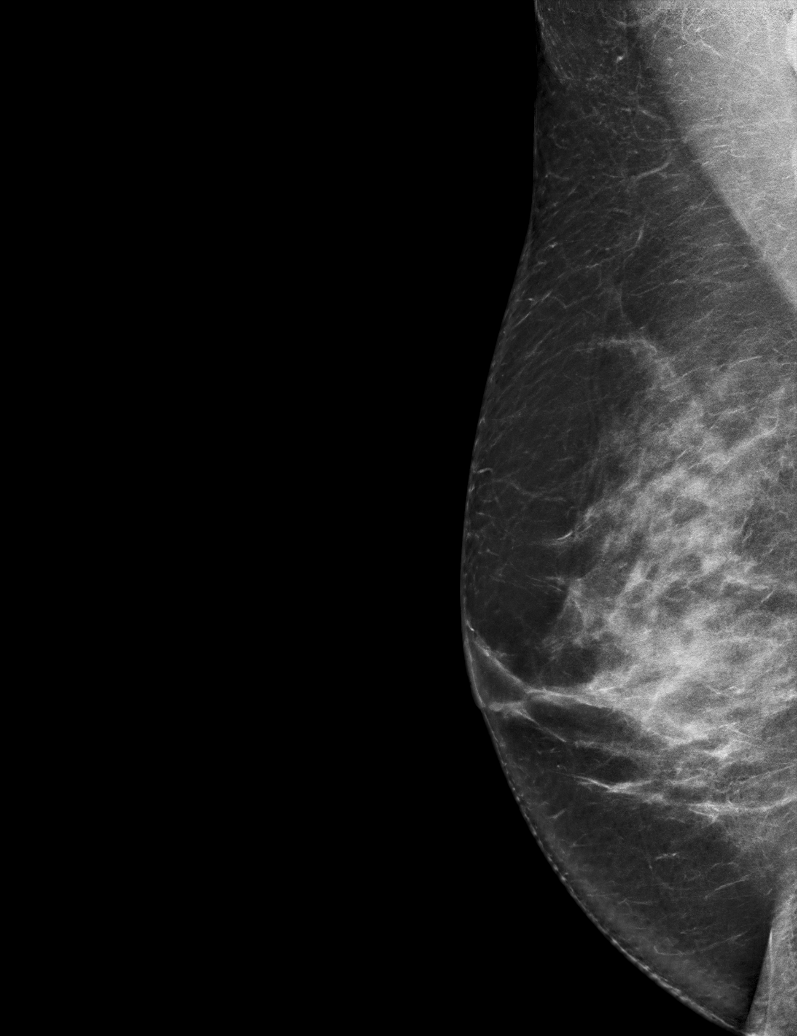

[R MLO synth-2D (2 of 2)]
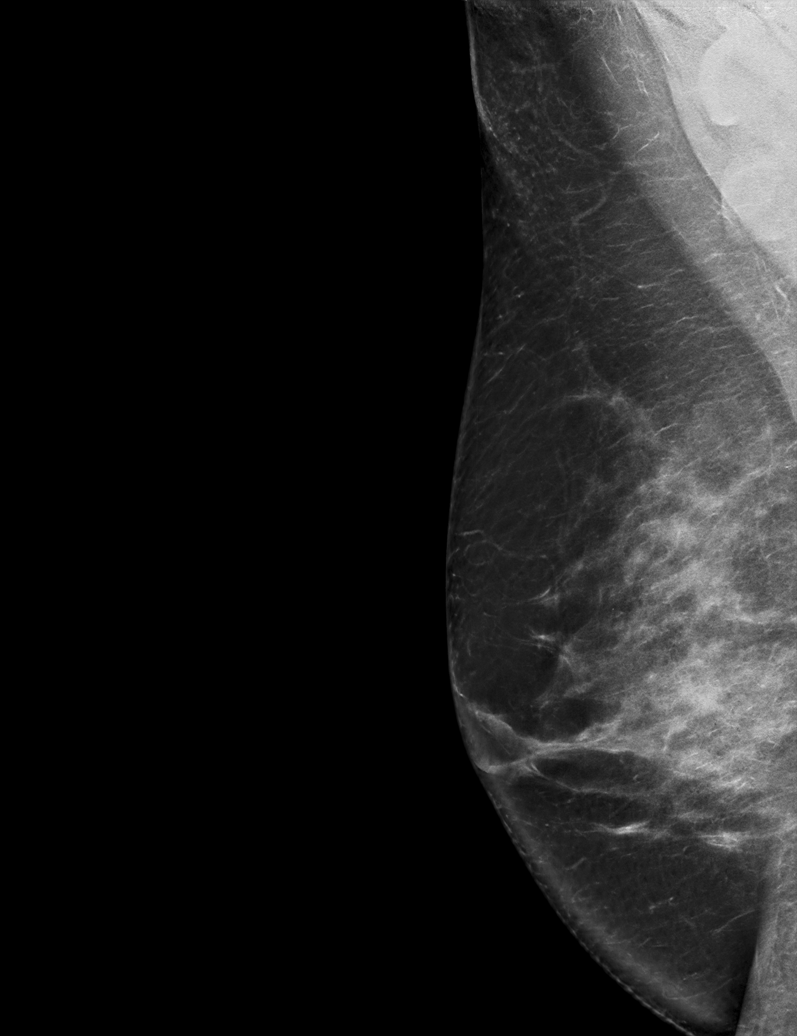

[L CC synth-2D]
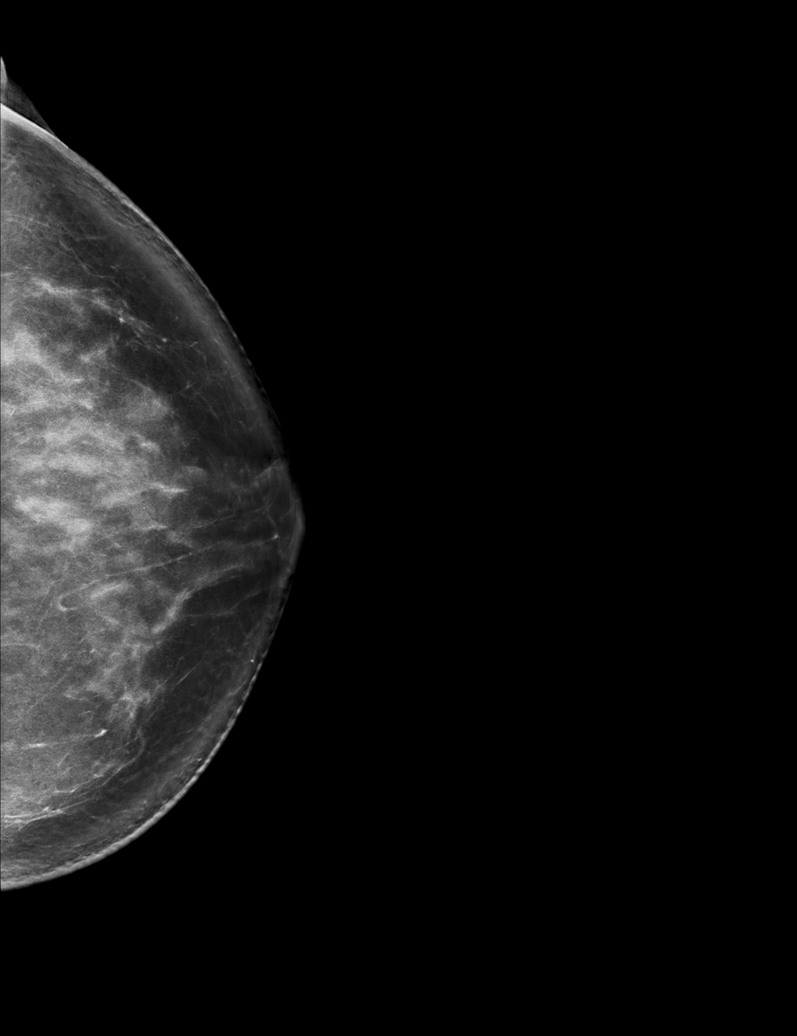

[L MLO synth-2D]
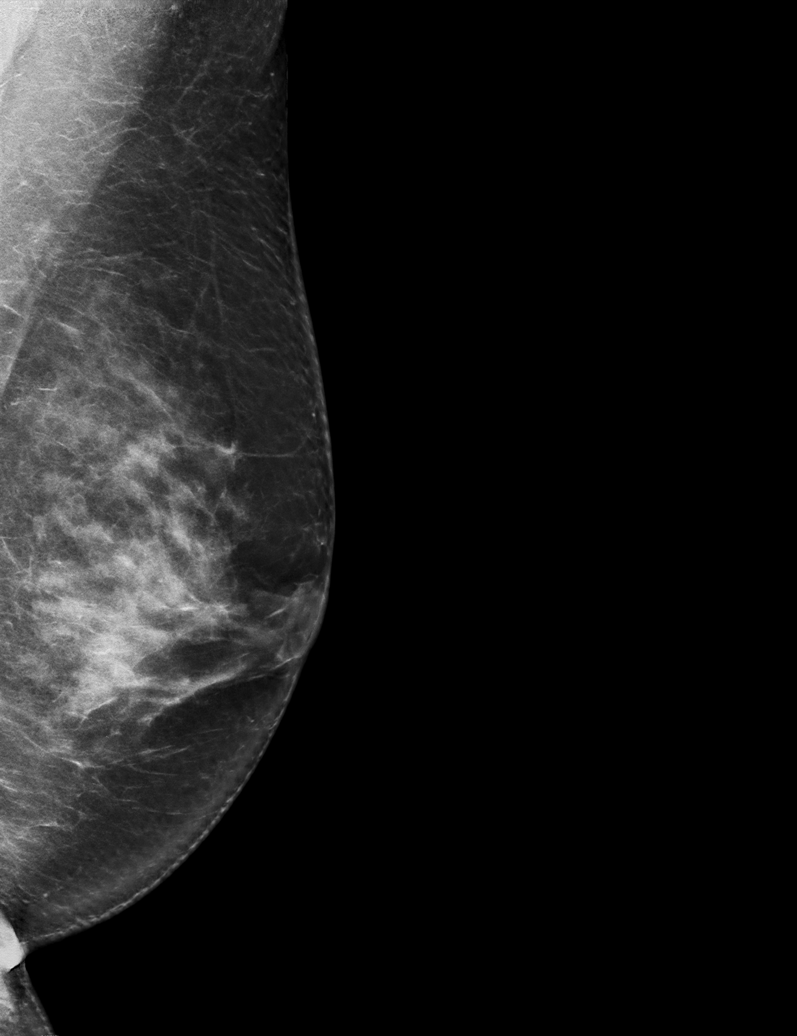

[R MLO tomo · tomo slice 47/94.0]
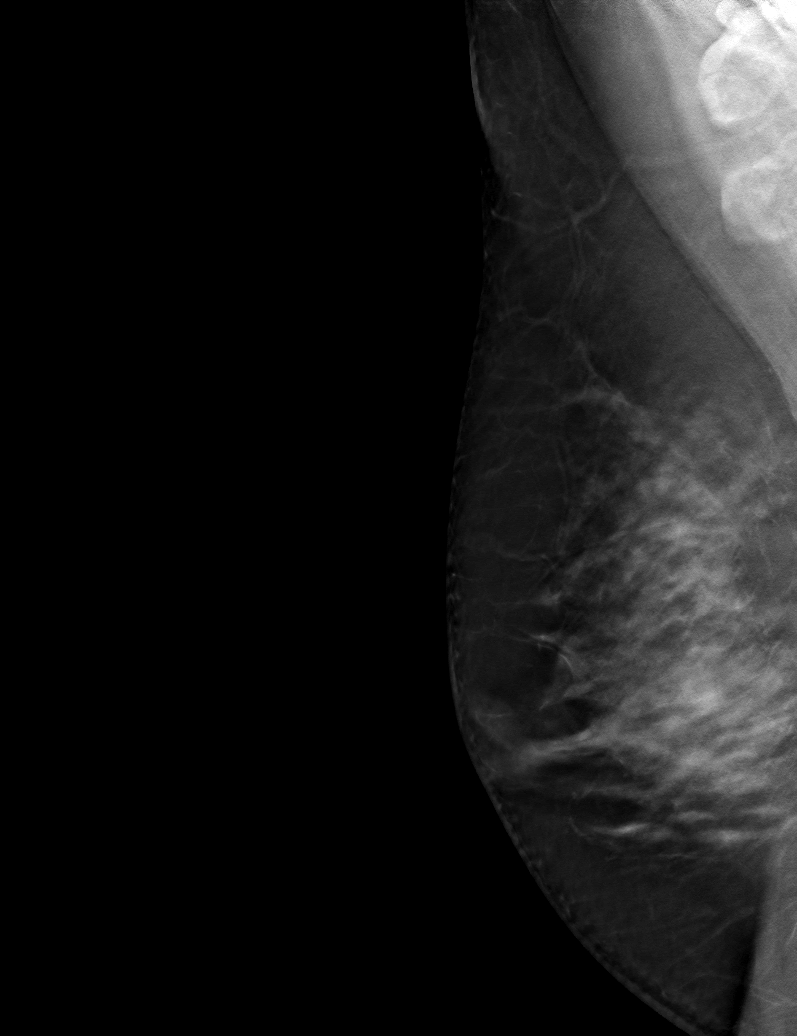

[6 of 30 positions shown; findings below may reference images not displayed]

ACR Breast Density Category c: The breast tissue is heterogeneously
dense, which may obscure small masses
FINDINGS: There are no findings suspicious for malignancy. Images were
processed with CAD.
IMPRESSION: No mammographic evidence of malignancy. A result letter of this
screening mammogram will be mailed directly to the patient.

RECOMMENDATION:
Screening mammogram in one year. (Code:EM-2-IHY)

BI-RADS CATEGORY  1: Negative.

## 2021-10-07 ENCOUNTER — Ambulatory Visit: Payer: BC Managed Care – PPO | Admitting: Family Medicine

## 2021-10-07 ENCOUNTER — Encounter: Payer: Self-pay | Admitting: Family Medicine

## 2021-10-07 ENCOUNTER — Other Ambulatory Visit: Payer: Self-pay

## 2021-10-07 VITALS — BP 120/78 | HR 100 | Temp 98.3°F | Resp 16 | Wt 145.0 lb

## 2021-10-07 DIAGNOSIS — H6983 Other specified disorders of Eustachian tube, bilateral: Secondary | ICD-10-CM

## 2021-10-07 DIAGNOSIS — R0982 Postnasal drip: Secondary | ICD-10-CM | POA: Diagnosis not present

## 2021-10-07 DIAGNOSIS — J302 Other seasonal allergic rhinitis: Secondary | ICD-10-CM | POA: Diagnosis not present

## 2021-10-07 DIAGNOSIS — H6993 Unspecified Eustachian tube disorder, bilateral: Secondary | ICD-10-CM

## 2021-10-07 NOTE — Progress Notes (Signed)
?  ? ?I,Joseline E Rosas,acting as a scribe for Lavon Paganini, MD.,have documented all relevant documentation on the behalf of Lavon Paganini, MD,as directed by  Lavon Paganini, MD while in the presence of Lavon Paganini, MD.  ? ?Established patient visit ? ? ?Patient: Latasha Cannon   DOB: 09/18/1980   41 y.o. Female  MRN: [id number] ?Visit Date: 10/07/2021 ? ?Today's healthcare provider: Lavon Paganini, MD  ? ?Chief Complaint  ?Patient presents with  ? Cough  ? ?Subjective  ?  ?Cough ?This is a new problem. The current episode started 1 to 4 weeks ago (2 weeks ago with sneezing and ryny nose, postnasa drip). The problem has been gradually improving. The problem occurs every few minutes. The cough is Productive of sputum. Associated symptoms include chills, ear congestion, nasal congestion and postnasal drip. Pertinent negatives include no chest pain, ear pain, headaches or shortness of breath. Nothing aggravates the symptoms. She has tried OTC cough suppressant (flonase, Benadryl. Alka Seltzer cold,nasal decongestion) for the symptoms. The treatment provided no relief. Took a Covid test at home and it was negative. ? ?Husband sick with bronchitis currently.  Normal temperature, but subjective hot and cold changes. ? ?Medications: ?Outpatient Medications Prior to Visit  ?Medication Sig  ? Cholecalciferol 25 MCG (1000 UT) tablet Take 2,000 Units by mouth daily.  ? citalopram (CELEXA) 20 MG tablet Take 1 tablet (20 mg total) by mouth daily.  ? levothyroxine (SYNTHROID) 100 MCG tablet Take 100 mcg by mouth daily before breakfast.  ? naproxen sodium (ANAPROX) 220 MG tablet Take 220 mg by mouth as needed.  ? loratadine (CLARITIN) 10 MG tablet Take 10 mg by mouth. (Patient not taking: Reported on 10/07/2021)  ? SUMAtriptan (IMITREX) 50 MG tablet Take 1 tablet (50 mg total) by mouth every 2 (two) hours as needed for migraine. May repeat in 2 hours if headache persists or recurs. (Patient not taking: Reported  on 10/07/2021)  ? ?No facility-administered medications prior to visit.  ? ? ?Review of Systems  ?Constitutional:  Positive for chills.  ?HENT:  Positive for postnasal drip. Negative for ear pain.   ?Respiratory:  Positive for cough. Negative for shortness of breath.   ?Cardiovascular:  Negative for chest pain.  ?Neurological:  Negative for headaches.  ? ? ?  Objective  ?  ?BP 120/78 (BP Location: Left Arm, Patient Position: Sitting, Cuff Size: Normal)   Pulse 100   Temp 98.3 ?F (36.8 ?C) (Temporal)   Resp 16   Wt 145 lb (65.8 kg)   SpO2 100%   BMI 24.89 kg/m?  ? ? ?Physical Exam ?Vitals reviewed.  ?Constitutional:   ?   General: She is not in acute distress. ?   Appearance: Normal appearance. She is well-developed. She is not diaphoretic.  ?HENT:  ?   Head: Normocephalic and atraumatic.  ?   Right Ear: Tympanic membrane, ear canal and external ear normal.  ?   Left Ear: Tympanic membrane, ear canal and external ear normal.  ?   Nose: Congestion present.  ?   Mouth/Throat:  ?   Mouth: Mucous membranes are moist.  ?   Pharynx: Oropharynx is clear. Posterior oropharyngeal erythema (mild) present.  ?Eyes:  ?   General: No scleral icterus. ?   Conjunctiva/sclera: Conjunctivae normal.  ?Neck:  ?   Thyroid: No thyromegaly.  ?Cardiovascular:  ?   Rate and Rhythm: Normal rate and regular rhythm.  ?   Pulses: Normal pulses.  ?   Heart sounds:  Normal heart sounds. No murmur heard. ?Pulmonary:  ?   Effort: Pulmonary effort is normal. No respiratory distress.  ?   Breath sounds: Normal breath sounds. No wheezing, rhonchi or rales.  ?Musculoskeletal:  ?   Cervical back: Neck supple.  ?   Right lower leg: No edema.  ?   Left lower leg: No edema.  ?Lymphadenopathy:  ?   Cervical: No cervical adenopathy.  ?Skin: ?   General: Skin is warm and dry.  ?Neurological:  ?   Mental Status: She is alert and oriented to person, place, and time. Mental status is at baseline.  ?Psychiatric:     ?   Mood and Affect: Mood normal.     ?    Behavior: Behavior normal.  ?  ? ? ?No results found for any visits on 10/07/21. ? Assessment & Plan  ?  ? ?1. Seasonal allergic rhinitis, unspecified trigger ?2. Postnasal drip ?3. Dysfunction of both eustachian tubes ?- recurrent issue ?- now worse with postnasal drip causing productive cough ?- lungs are clear with no signs of bronchitis or other disease process ?- no signs of infectious process ?- resume H2 blocker regularly ?- continue daily flonase ?- consider addition of singulair if needed ? ?Return if symptoms worsen or fail to improve.  ?   ? ?I, Lavon Paganini, MD, have reviewed all documentation for this visit. The documentation on 10/07/21 for the exam, diagnosis, procedures, and orders are all accurate and complete. ? ? ?Virginia Crews, MD, MPH ?[place] ?[facility]   ?

## 2021-11-13 ENCOUNTER — Ambulatory Visit: Payer: BC Managed Care – PPO | Admitting: Dermatology

## 2021-11-13 DIAGNOSIS — L820 Inflamed seborrheic keratosis: Secondary | ICD-10-CM

## 2021-11-13 DIAGNOSIS — D1800 Hemangioma unspecified site: Secondary | ICD-10-CM

## 2021-11-13 DIAGNOSIS — Z1283 Encounter for screening for malignant neoplasm of skin: Secondary | ICD-10-CM | POA: Diagnosis not present

## 2021-11-13 DIAGNOSIS — D229 Melanocytic nevi, unspecified: Secondary | ICD-10-CM

## 2021-11-13 DIAGNOSIS — Z86006 Personal history of melanoma in-situ: Secondary | ICD-10-CM

## 2021-11-13 DIAGNOSIS — L814 Other melanin hyperpigmentation: Secondary | ICD-10-CM | POA: Diagnosis not present

## 2021-11-13 DIAGNOSIS — Z86018 Personal history of other benign neoplasm: Secondary | ICD-10-CM

## 2021-11-13 DIAGNOSIS — D492 Neoplasm of unspecified behavior of bone, soft tissue, and skin: Secondary | ICD-10-CM

## 2021-11-13 DIAGNOSIS — D034 Melanoma in situ of scalp and neck: Secondary | ICD-10-CM | POA: Diagnosis not present

## 2021-11-13 DIAGNOSIS — L578 Other skin changes due to chronic exposure to nonionizing radiation: Secondary | ICD-10-CM

## 2021-11-13 DIAGNOSIS — L821 Other seborrheic keratosis: Secondary | ICD-10-CM | POA: Diagnosis not present

## 2021-11-13 NOTE — Patient Instructions (Addendum)
Wound Care Instructions ? ?Cleanse wound gently with soap and water once a day then pat dry with clean gauze. Apply a thing coat of Petrolatum (petroleum jelly, "Vaseline") over the wound (unless you have an allergy to this). We recommend that you use a new, sterile tube of Vaseline. Do not pick or remove scabs. Do not remove the yellow or white "healing tissue" from the base of the wound. ? ?Cover the wound with fresh, clean, nonstick gauze and secure with paper tape. You may use Band-Aids in place of gauze and tape if the would is small enough, but would recommend trimming much of the tape off as there is often too much. Sometimes Band-Aids can irritate the skin. ? ?You should call the office for your biopsy report after 1 week if you have not already been contacted. ? ?If you experience any problems, such as abnormal amounts of bleeding, swelling, significant bruising, significant pain, or evidence of infection, please call the office immediately. ? ?FOR ADULT SURGERY PATIENTS: If you need something for pain relief you may take 1 extra strength Tylenol (acetaminophen) AND 2 Ibuprofen ('200mg'$  each) together every 4 hours as needed for pain. (do not take these if you are allergic to them or if you have a reason you should not take them.) Typically, you may only need pain medication for 1 to 3 days.  ? ?Cryotherapy Aftercare ? ?Wash gently with soap and water everyday.   ?Apply Vaseline and Band-Aid daily until healed.  ? ? ? ?If You Need Anything After Your Visit ? ?If you have any questions or concerns for your doctor, please call our main line at [phone] and press option 4 to reach your doctor's medical assistant. If no one answers, please leave a voicemail as directed and we will return your call as soon as possible. Messages left after 4 pm will be answered the following business day.  ? ?You may also send [place] a message via MyChart. We typically respond to MyChart messages within 1-2 business days. ? ?For  prescription refills, please ask your pharmacy to contact our office. Our fax number is [phone]. ? ?If you have an urgent issue when the clinic is closed that cannot wait until the next business day, you can page your doctor at the number below.   ? ?Please note that while we do our best to be available for urgent issues outside of office hours, we are not available 24/7.  ? ?If you have an urgent issue and are unable to reach [place], you may choose to seek medical care at your doctor's office, retail clinic, urgent care center, or emergency room. ? ?If you have a medical emergency, please immediately call 911 or go to the emergency department. ? ?Pager Numbers ? ?- Dr. Nehemiah Massed: [phone] ? ?- Dr. Laurence Ferrari: [phone] ? ?- Dr. Nicole Kindred: [phone] ? ?In the event of inclement weather, please call our main line at [phone] for an update on the status of any delays or closures. ? ?Dermatology Medication Tips: ?Please keep the boxes that topical medications come in in order to help keep track of the instructions about where and how to use these. Pharmacies typically print the medication instructions only on the boxes and not directly on the medication tubes.  ? ?If your medication is too expensive, please contact our office at [phone] option 4 or send [place] a message through [place].  ? ?We are unable to tell what your co-pay for medications will be in advance as this  is different depending on your insurance coverage. However, we may be able to find a substitute medication at lower cost or fill out paperwork to get insurance to cover a needed medication.  ? ?If a prior authorization is required to get your medication covered by your insurance company, please allow [place] 1-2 business days to complete this process. ? ?Drug prices often vary depending on where the prescription is filled and some pharmacies may offer cheaper prices. ? ?The website www.goodrx.com contains coupons for medications through different  pharmacies. The prices here do not account for what the cost may be with help from insurance (it may be cheaper with your insurance), but the website can give you the price if you did not use any insurance.  ?- You can print the associated coupon and take it with your prescription to the pharmacy.  ?- You may also stop by our office during regular business hours and pick up a GoodRx coupon card.  ?- If you need your prescription sent electronically to a different pharmacy, notify our office through [facility] or by phone at [phone] option 4. ? ? ? ? ?Si Usted Necesita Algo Despu?s de Su Visita ? ?Tambi?n puede enviarnos un mensaje a trav?s de MyChart. Por lo general respondemos a los mensajes de MyChart en el transcurso de 1 a 2 d?as h?biles. ? ?Para renovar recetas, por favor pida a su farmacia que se ponga en contacto con nuestra oficina. Nuestro n?mero de fax es el [phone]. ? ?Si tiene un asunto urgente cuando la cl?nica est? cerrada y que no puede esperar hasta el siguiente d?a h?bil, puede llamar/localizar a su doctor(a) al n?mero que aparece a continuaci?n.  ? ?Por favor, tenga en cuenta que aunque hacemos todo lo posible para estar disponibles para asuntos urgentes fuera del horario de oficina, no estamos disponibles las 24 horas del d?a, los 7 d?as de la semana.  ? ?Si tiene un problema urgente y no puede comunicarse con nosotros, puede optar por buscar atenci?n m?dica  en el consultorio de su doctor(a), en una cl?nica privada, en un centro de atenci?n urgente o en una sala de emergencias. ? ?Si tiene Engineer, maintenance (IT) m?dica, por favor llame inmediatamente al 911 o vaya a la sala de emergencias. ? ?N?meros de b?per ? ?- Dr. Nehemiah Massed: [phone] ? ?- Dra. Moye: [phone] ? ?- Dra. Nicole Kindred: [phone] ? ?En caso de inclemencias del tiempo, por favor llame a nuestra l?nea principal al [phone] para una actualizaci?n sobre el estado de cualquier retraso o cierre. ? ?Consejos para la  medicaci?n en dermatolog?a: ?Por favor, guarde las cajas en las que vienen los medicamentos de uso t?pico para ayudarle a seguir las instrucciones sobre d?nde y c?mo usarlos. Las farmacias generalmente imprimen las instrucciones del medicamento s?lo en las cajas y no directamente en los tubos del [place].  ? ?Si su medicamento es muy caro, por favor, p?ngase en contacto con Zigmund Daniel llamando al [phone] y presione la opci?n 4 o env?enos un mensaje a trav?s de MyChart.  ? ?No podemos decirle cu?l ser? su copago por los medicamentos por adelantado ya que esto es diferente dependiendo de la cobertura de su seguro. Sin embargo, es posible que podamos encontrar un medicamento sustituto a Electrical engineer un formulario para que el seguro cubra el medicamento que se considera necesario.  ? ?Si se requiere Ardelia Mems autorizaci?n previa para que su compa??a de seguros [place] su medicamento, por favor perm?tanos de 1 a 2 d?as h?biles para  completar [employer]. ? ?Los precios de los medicamentos var?an con frecuencia dependiendo del Environmental consultant de d?nde se surte la receta y alguna farmacias pueden ofrecer precios m?s baratos. ? ?El sitio web www.goodrx.com tiene cupones para medicamentos de Airline pilot. Los precios aqu? no tienen en cuenta lo que podr?a costar con la ayuda del seguro (puede ser m?s barato con su seguro), pero el sitio web puede darle el precio si no utiliz? ning?n seguro.  ?- Puede imprimir el cup?n correspondiente y llevarlo con su receta a la farmacia.  ?- Tambi?n puede pasar por nuestra oficina durante el horario de atenci?n regular y recoger una tarjeta de cupones de GoodRx.  ?- Si necesita que su receta se env?e electr?nicamente a Chiropodist, informe a nuestra oficina a trav?s de MyChart de [place] o por tel?fono llamando al [phone] y presione la opci?n 4.  ?

## 2021-11-13 NOTE — Progress Notes (Deleted)
? ?  Follow-Up Visit ?  ?Subjective  ?Latasha Cannon is a 41 y.o. female who presents for the following: Total body skin exam (Hx of Melanoma IS R prox dorsum med great toe, excised 10/16/20, hx of Dysplastic Nevi L sup lat calf, L dorsum middle toe med aspect ). ?The patient presents for Total-Body Skin Exam (TBSE) for skin cancer screening and mole check.  The patient has spots, moles and lesions to be evaluated, some may be new or changing and the patient has concerns that these could be cancer.  ? ?The following portions of the chart were reviewed this encounter and updated as appropriate:  ? Tobacco  Allergies  Meds  Problems  Med Hx  Surg Hx  Fam Hx   ?  ?Review of Systems:  No other skin or systemic complaints except as noted in HPI or Assessment and Plan. ? ?Objective  ?Well appearing patient in no apparent distress; mood and affect are within normal limits. ? ?A full examination was performed including scalp, head, eyes, ears, nose, lips, neck, chest, axillae, abdomen, back, buttocks, bilateral upper extremities, bilateral lower extremities, hands, feet, fingers, toes, fingernails, and toenails. All findings within normal limits unless otherwise noted below. ? ?R proximal dorsum medial great toe ?Well healed scar with no evidence of recurrence, no lymphadenopathy.  ? ?R post lat base of neck ?0.8cm irregular brown macule ? ? ? ? ?R base of neck x 1 ?Stuck on waxy pap with erythema ? ? ?Assessment & Plan  ? ?Lentigines ?- Scattered tan macules ?- Due to sun exposure ?- Benign-appearing, observe ?- Recommend daily broad spectrum sunscreen SPF 30+ to sun-exposed areas, reapply every 2 hours as needed. ?- Call for any changes ?- face ? ?Seborrheic Keratoses ?- Stuck-on, waxy, tan-brown papules and/or plaques  ?- Benign-appearing ?- Discussed benign etiology and prognosis. ?- Observe ?- Call for any changes ?- face ? ?Melanocytic Nevi ?- Tan-brown and/or pink-flesh-colored symmetric macules and papules ?-  Benign appearing on exam today ?- Observation ?- Call clinic for new or changing moles ?- Recommend daily use of broad spectrum spf 30+ sunscreen to sun-exposed areas.  ? ?Hemangiomas ?- Red papules ?- Discussed benign nature ?- Observe ?- Call for any changes ? ?Actinic Damage ?- Chronic condition, secondary to cumulative UV/sun exposure ?- diffuse scaly erythematous macules with underlying dyspigmentation ?- Recommend daily broad spectrum sunscreen SPF 30+ to sun-exposed areas, reapply every 2 hours as needed.  ?- Staying in the shade or wearing long sleeves, sun glasses (UVA+UVB protection) and wide brim hats (4-inch brim around the entire circumference of the hat) are also recommended for sun protection.  ?- Call for new or changing lesions. ? ?Skin cancer screening performed today. ? ?History of Dysplastic Nevi ?- No evidence of recurrence today ?- Recommend regular full body skin exams ?- Recommend daily broad spectrum sunscreen SPF 30+ to sun-exposed areas, reapply every 2 hours as needed.  ?- Call if any new or changing lesions are noted between office visits  ?- L sup lat calf, L dorsum middle toe med aspect  ? ?Return in about 4 months (around 03/15/2022) for TBSE, Hx of Melanoma IS, Hx of Dysplastic nevi. ? ?I, Othelia Pulling, RMA, am acting as scribe for Sarina Ser, MD . ?Documentation: I have reviewed the above documentation for accuracy and completeness, and I agree with the above. ? ?Sarina Ser, MD ? ?

## 2021-11-19 ENCOUNTER — Encounter: Payer: Self-pay | Admitting: Dermatology

## 2021-11-19 NOTE — Progress Notes (Signed)
? ?Follow-Up Visit ?  ?Subjective  ?Latasha Cannon is a 41 y.o. female who presents for the following: Total body skin exam (Hx of Melanoma IS R prox dorsum med great toe, excised 10/16/20, hx of Dysplastic Nevi L sup lat calf, L dorsum middle toe med aspect ). ?The patient presents for Total-Body Skin Exam (TBSE) for skin cancer screening and mole check.  The patient has spots, moles and lesions to be evaluated, some may be new or changing and the patient has concerns that these could be cancer. ? ?The following portions of the chart were reviewed this encounter and updated as appropriate:  ? Tobacco  Allergies  Meds  Problems  Med Hx  Surg Hx  Fam Hx   ?  ?Review of Systems:  No other skin or systemic complaints except as noted in HPI or Assessment and Plan. ? ?Objective  ?Well appearing patient in no apparent distress; mood and affect are within normal limits. ? ?A full examination was performed including scalp, head, eyes, ears, nose, lips, neck, chest, axillae, abdomen, back, buttocks, bilateral upper extremities, bilateral lower extremities, hands, feet, fingers, toes, fingernails, and toenails. All findings within normal limits unless otherwise noted below. ? ?R proximal dorsum medial great toe ?Well healed scar with no evidence of recurrence, no lymphadenopathy.  ? ?R post lat base of neck ?0.8cm irregular brown macule ? ? ? ? ?R base of neck x 1 ?Stuck on waxy pap with erythema ? ? ?Assessment & Plan  ? ?Lentigines ?- Scattered tan macules ?- Due to sun exposure ?- Benign-appearing, observe ?- Recommend daily broad spectrum sunscreen SPF 30+ to sun-exposed areas, reapply every 2 hours as needed. ?- Call for any changes ? ?Seborrheic Keratoses ?- Stuck-on, waxy, tan-brown papules and/or plaques  ?- Benign-appearing ?- Discussed benign etiology and prognosis. ?- Observe ?- Call for any changes ? ?Melanocytic Nevi ?- Tan-brown and/or pink-flesh-colored symmetric macules and papules ?- Benign appearing  on exam today ?- Observation ?- Call clinic for new or changing moles ?- Recommend daily use of broad spectrum spf 30+ sunscreen to sun-exposed areas.  ? ?Hemangiomas ?- Red papules ?- Discussed benign nature ?- Observe ?- Call for any changes ? ?Actinic Damage ?- Chronic condition, secondary to cumulative UV/sun exposure ?- diffuse scaly erythematous macules with underlying dyspigmentation ?- Recommend daily broad spectrum sunscreen SPF 30+ to sun-exposed areas, reapply every 2 hours as needed.  ?- Staying in the shade or wearing long sleeves, sun glasses (UVA+UVB protection) and wide brim hats (4-inch brim around the entire circumference of the hat) are also recommended for sun protection.  ?- Call for new or changing lesions. ? ?Skin cancer screening performed today. ? ?History of melanoma in situ ?R proximal dorsum medial great toe ?Excised 10/16/20 ?Clear. Observe for recurrence.  No lymphadenopathy.  Call clinic for new or changing lesions.  Recommend regular skin exams, daily broad-spectrum spf 30+ sunscreen use, and photoprotection.   ? ?Neoplasm of skin ?R post lat base of neck ?Epidermal / dermal shaving ? ?Lesion diameter (cm):  0.8 ?Informed consent: discussed and consent obtained   ?Timeout: patient name, date of birth, surgical site, and procedure verified   ?Procedure prep:  Patient was prepped and draped in usual sterile fashion ?Prep type:  Isopropyl alcohol ?Anesthesia: the lesion was anesthetized in a standard fashion   ?Anesthetic:  1% lidocaine w/ epinephrine 1-100,000 buffered w/ 8.4% NaHCO3 ?Instrument used: flexible razor blade   ?Hemostasis achieved with: pressure, aluminum chloride and electrodesiccation   ?  Outcome: patient tolerated procedure well   ?Post-procedure details: sterile dressing applied and wound care instructions given   ?Dressing type: bandage and bacitracin   ? ?Specimen 1 - Surgical pathology ?Differential Diagnosis: D48.5 Nevus vs Dysplastic Nevus ?Check Margins:  yes ?0.8cm irregular brown macule ? ?Inflamed seborrheic keratosis ?R base of neck x 1 ?Destruction of lesion - R base of neck x 1 ?Complexity: simple   ?Destruction method: cryotherapy   ?Informed consent: discussed and consent obtained   ?Timeout:  patient name, date of birth, surgical site, and procedure verified ?Lesion destroyed using liquid nitrogen: Yes   ?Region frozen until ice ball extended beyond lesion: Yes   ?Outcome: patient tolerated procedure well with no complications   ?Post-procedure details: wound care instructions given   ? ?Skin cancer screening ? ?Return in about 4 months (around 03/15/2022) for TBSE, Hx of Melanoma IS, Hx of Dysplastic nevi. ? ?I, Othelia Pulling, RMA, am acting as scribe for Sarina Ser, MD . ?Documentation: I have reviewed the above documentation for accuracy and completeness, and I agree with the above. ? ?Sarina Ser, MD ? ?

## 2021-11-21 ENCOUNTER — Telehealth: Payer: Self-pay

## 2021-11-21 NOTE — Telephone Encounter (Signed)
Called pt scheduled her for surgery appt July 11 at 3.30 ? ? ? ?right post lat base of neck ?MELANOMA IN SITU, PERIPHERAL MARGIN INVOLVED ?  ?

## 2021-11-21 NOTE — Telephone Encounter (Signed)
-----   Message from Ralene Bathe, MD sent at 11/19/2021  7:56 PM EDT ----- ?Diagnosis ?Skin , right post lat base of neck ?MELANOMA IN SITU, PERIPHERAL MARGIN INVOLVED ? ?Cancer - Melanoma in situ ?Superficial and early ?Recommend Wide Local Excision ?Discussed with pt on phone 11/19/2021 ?Pt is awaiting call for scheduling surgery ?

## 2022-01-28 ENCOUNTER — Encounter: Payer: Self-pay | Admitting: Dermatology

## 2022-01-28 ENCOUNTER — Ambulatory Visit: Payer: BC Managed Care – PPO | Admitting: Dermatology

## 2022-01-28 DIAGNOSIS — D492 Neoplasm of unspecified behavior of bone, soft tissue, and skin: Secondary | ICD-10-CM

## 2022-01-28 DIAGNOSIS — D034 Melanoma in situ of scalp and neck: Secondary | ICD-10-CM | POA: Diagnosis not present

## 2022-01-28 MED ORDER — MUPIROCIN 2 % EX OINT: 1.0000 | TOPICAL_OINTMENT | Freq: Every day | CUTANEOUS | 1 refills | Status: DC

## 2022-01-28 MED ORDER — DOXYCYCLINE MONOHYDRATE 100 MG PO TABS: 100.0000 mg | ORAL_TABLET | Freq: Two times a day (BID) | ORAL | 0 refills | Status: AC

## 2022-01-28 NOTE — Progress Notes (Signed)
Follow-Up Visit   Subjective  Latasha Cannon is a 41 y.o. female who presents for the following: Melanoma IS bx proven (Bx proven, right post lat base of neck, pt presents for excision).  The following portions of the chart were reviewed this encounter and updated as appropriate:   Tobacco  Allergies  Meds  Problems  Med Hx  Surg Hx  Fam Hx     Review of Systems:  No other skin or systemic complaints except as noted in HPI or Assessment and Plan.  Objective  Well appearing patient in no apparent distress; mood and affect are within normal limits.  A focused examination was performed including neck. Relevant physical exam findings are noted in the Assessment and Plan.  right post lat base of neck Pink bx site   Assessment & Plan  Neoplasm of skin right post lat base of neck  Skin excision  Lesion length (cm):  1.1 Lesion width (cm):  1.1 Margin per side (cm):  0.5 Total excision diameter (cm):  2.1 Informed consent: discussed and consent obtained   Timeout: patient name, date of birth, surgical site, and procedure verified   Procedure prep:  Patient was prepped and draped in usual sterile fashion Prep type:  Isopropyl alcohol and povidone-iodine Anesthesia: the lesion was anesthetized in a standard fashion   Anesthetic:  1% lidocaine w/ epinephrine 1-100,000 buffered w/ 8.4% NaHCO3 Instrument used: #15 blade   Hemostasis achieved with: pressure   Hemostasis achieved with comment:  Electrocautery Outcome: patient tolerated procedure well with no complications   Post-procedure details: sterile dressing applied and wound care instructions given   Dressing type: bandage and pressure dressing (Mupirocin)    Skin repair Complexity:  Complex Final length (cm):  4 Reason for type of repair: reduce tension to allow closure, reduce the risk of dehiscence, infection, and necrosis, reduce subcutaneous dead space and avoid a hematoma, allow closure of the large defect,  preserve normal anatomy, preserve normal anatomical and functional relationships and enhance both functionality and cosmetic results   Undermining: area extensively undermined   Undermining comment:  Undermining Defect 2.1 cm Subcutaneous layers (deep stitches):  Suture size:  2-0 Suture type: Vicryl (polyglactin 910)   Subcutaneous suture technique: Inverted Dermal. Fine/surface layer approximation (top stitches):  Suture size:  3-0 Stitches: simple interrupted and simple running   Suture removal (days):  7 Hemostasis achieved with: suture and pressure Outcome: patient tolerated procedure well with no complications   Post-procedure details: sterile dressing applied and wound care instructions given   Dressing type: bandage, pressure dressing and bacitracin (Mupirocin)    mupirocin ointment (BACTROBAN) 2 % Apply 1 Application topically daily. Qd to excision site  doxycycline (ADOXA) 100 MG tablet Take 1 tablet (100 mg total) by mouth 2 (two) times daily for 7 days. Take with food and drink  Specimen 1 - Surgical pathology Differential Diagnosis: Bx proven Melanoma IS  Check Margins: yes  [id number]  Melanoma In Situ, bx proven, pt presents for excision Start Mupirocin oint qd Start Doxycycline '100mg'$  1 po bid with food and drink for 7 days #14 0rf  Discussed diagnosis in detail including significance of melanoma diagnosis which can be potentially lethal.  Discussed treatment recommendations in detail advising that treatment recommendations are based on longitudinal studies and retrospective studies and are nationwide protocols.  Advised there is always potential for recurrence even after definitive treatment.  After definitive treatment, we recommend total-body skin exams every 3 months for a year; then  every 4 months for a year; then every 6 months for 3 years.  At 5 years post treatment, if all looks good we would recommend at least yearly total-body skin exams for the rest of  your life.  The patient was given time for questions and these were answered.  We recommend frequent self skin examinations; photoprotection with sunscreen, sun protective clothing, hats, sunglasses and sun avoidance.  If the patient notices any new or changing skin lesions the patient should return to the office immediately for evaluation.    Doxycycline should be taken with food to prevent nausea. Do not lay down for 30 minutes after taking. Be cautious with sun exposure and use good sun protection while on this medication. Pregnant women should not take this medication.     Return in about 1 week (around 02/04/2022) for suture removal.  I, Ashok Cordia, CMA, am acting as scribe for Sarina Ser, MD . Documentation: I have reviewed the above documentation for accuracy and completeness, and I agree with the above.  Sarina Ser, MD

## 2022-01-28 NOTE — Patient Instructions (Addendum)

## 2022-01-29 ENCOUNTER — Telehealth: Payer: Self-pay

## 2022-01-29 NOTE — Telephone Encounter (Signed)
Pt doing fine after yesterdays surgery./sh 

## 2022-02-04 ENCOUNTER — Ambulatory Visit (INDEPENDENT_AMBULATORY_CARE_PROVIDER_SITE_OTHER): Payer: BC Managed Care – PPO | Admitting: Dermatology

## 2022-02-04 DIAGNOSIS — D034 Melanoma in situ of scalp and neck: Secondary | ICD-10-CM

## 2022-02-04 DIAGNOSIS — Z4802 Encounter for removal of sutures: Secondary | ICD-10-CM

## 2022-02-04 NOTE — Patient Instructions (Signed)
After Suture Removal  If your medical team has placed Steri-Strips (white adhesive strips covering the surgical site to provide extra support): Keep the area dry until they fall off.  Do not peel them off. Just let them fall off on their own.  If the edges peel up, you can trim them with scissors.   If your team has not placed Steri-Strips: Wash the area daily with soap and water. Then coat the incision site with plain Vaseline and cover with a bandage. Do this daily for 5 days after the sutures are removed. After that, no additional wound care is generally needed.  However, if you would like to help fade the scar, you can apply a silicone scar cream, gel or sheet every night. The scar will remodel for one year after the procedure. If a skin cancer was removed, be sure to keep your appointment with your dermatologist for follow-up and let your dermatology team know if you have any new or changing spots between visits.    Please call our office at [phone] for any questions or concerns.       Due to recent changes in healthcare laws, you may see results of your pathology and/or laboratory studies on MyChart before the doctors have had a chance to review them. We understand that in some cases there may be results that are confusing or concerning to you. Please understand that not all results are received at the same time and often the doctors may need to interpret multiple results in order to provide you with the best plan of care or course of treatment. Therefore, we ask that you please give us 2 business days to thoroughly review all your results before contacting the office for clarification. Should we see a critical lab result, you will be contacted sooner.   If You Need Anything After Your Visit  If you have any questions or concerns for your doctor, please call our main line at [phone] and press option 4 to reach your doctor's medical assistant. If no one answers, please leave  a voicemail as directed and we will return your call as soon as possible. Messages left after 4 pm will be answered the following business day.   You may also send us a message via MyChart. We typically respond to MyChart messages within 1-2 business days.  For prescription refills, please ask your pharmacy to contact our office. Our fax number is [phone].  If you have an urgent issue when the clinic is closed that cannot wait until the next business day, you can page your doctor at the number below.    Please note that while we do our best to be available for urgent issues outside of office hours, we are not available 24/7.   If you have an urgent issue and are unable to reach us, you may choose to seek medical care at your doctor's office, retail clinic, urgent care center, or emergency room.  If you have a medical emergency, please immediately call 911 or go to the emergency department.  Pager Numbers  - Dr. Kowalski: [phone]  - Dr. Moye: [phone]  - Dr. Stewart: [phone]  In the event of inclement weather, please call our main line at [phone] for an update on the status of any delays or closures.  Dermatology Medication Tips: Please keep the boxes that topical medications come in in order to help keep track of the instructions about where and how to use these. Pharmacies typically print the medication   instructions only on the boxes and not directly on the medication tubes.   If your medication is too expensive, please contact our office at [phone] option 4 or send us a message through MyChart.   We are unable to tell what your co-pay for medications will be in advance as this is different depending on your insurance coverage. However, we may be able to find a substitute medication at lower cost or fill out paperwork to get insurance to cover a needed medication.   If a prior authorization is required to get your medication covered by your insurance  company, please allow us 1-2 business days to complete this process.  Drug prices often vary depending on where the prescription is filled and some pharmacies may offer cheaper prices.  The website www.goodrx.com contains coupons for medications through different pharmacies. The prices here do not account for what the cost may be with help from insurance (it may be cheaper with your insurance), but the website can give you the price if you did not use any insurance.  - You can print the associated coupon and take it with your prescription to the pharmacy.  - You may also stop by our office during regular business hours and pick up a GoodRx coupon card.  - If you need your prescription sent electronically to a different pharmacy, notify our office through [facility] MyChart or by phone at [phone] option 4.     Si Usted Necesita Algo Despus de Su Visita  Tambin puede enviarnos un mensaje a travs de MyChart. Por lo general respondemos a los mensajes de MyChart en el transcurso de 1 a 2 das hbiles.  Para renovar recetas, por favor pida a su farmacia que se ponga en contacto con nuestra oficina. Nuestro nmero de fax es el [phone].  Si tiene un asunto urgente cuando la clnica est cerrada y que no puede esperar hasta el siguiente da hbil, puede llamar/localizar a su doctor(a) al nmero que aparece a continuacin.   Por favor, tenga en cuenta que aunque hacemos todo lo posible para estar disponibles para asuntos urgentes fuera del horario de oficina, no estamos disponibles las 24 horas del da, los 7 das de la semana.   Si tiene un problema urgente y no puede comunicarse con nosotros, puede optar por buscar atencin mdica  en el consultorio de su doctor(a), en una clnica privada, en un centro de atencin urgente o en una sala de emergencias.  Si tiene una emergencia mdica, por favor llame inmediatamente al 911 o vaya a la sala de emergencias.  Nmeros de bper  - Dr.  Kowalski: [phone]  - Dra. Moye: [phone]  - Dra. Stewart: [phone]  En caso de inclemencias del tiempo, por favor llame a nuestra lnea principal al [phone] para una actualizacin sobre el estado de cualquier retraso o cierre.  Consejos para la medicacin en dermatologa: Por favor, guarde las cajas en las que vienen los medicamentos de uso tpico para ayudarle a seguir las instrucciones sobre dnde y cmo usarlos. Las farmacias generalmente imprimen las instrucciones del medicamento slo en las cajas y no directamente en los tubos del medicamento.   Si su medicamento es muy caro, por favor, pngase en contacto con nuestra oficina llamando al [phone] y presione la opcin 4 o envenos un mensaje a travs de MyChart.   No podemos decirle cul ser su copago por los medicamentos por adelantado ya que esto es diferente dependiendo de la cobertura de su seguro. Sin embargo,   es posible que podamos encontrar un medicamento sustituto a menor costo o llenar un formulario para que el seguro cubra el medicamento que se considera necesario.   Si se requiere una autorizacin previa para que su compaa de seguros cubra su medicamento, por favor permtanos de 1 a 2 das hbiles para completar este proceso.  Los precios de los medicamentos varan con frecuencia dependiendo del lugar de dnde se surte la receta y alguna farmacias pueden ofrecer precios ms baratos.  El sitio web www.goodrx.com tiene cupones para medicamentos de diferentes farmacias. Los precios aqu no tienen en cuenta lo que podra costar con la ayuda del seguro (puede ser ms barato con su seguro), pero el sitio web puede darle el precio si no utiliz ningn seguro.  - Puede imprimir el cupn correspondiente y llevarlo con su receta a la farmacia.  - Tambin puede pasar por nuestra oficina durante el horario de atencin regular y recoger una tarjeta de cupones de GoodRx.  - Si necesita que su receta se enve  electrnicamente a una farmacia diferente, informe a nuestra oficina a travs de MyChart de [facility] o por telfono llamando al [phone] y presione la opcin 4.  

## 2022-02-04 NOTE — Progress Notes (Signed)
   Follow-Up Visit   Subjective  Latasha Cannon is a 41 y.o. female who presents for the following: Suture / Staple Removal (Patient here for suture removal at right posterior base of neck. Bx proven Melanoma In Situ. Patient reports area is still tender but feels is feeling much better. Reports almost finished with oral antibiotic. ).  The following portions of the chart were reviewed this encounter and updated as appropriate:  Tobacco  Allergies  Meds  Problems  Med Hx  Surg Hx  Fam Hx     Review of Systems: No other skin or systemic complaints except as noted in HPI or Assessment and Plan.   Objective  Well appearing patient in no apparent distress; mood and affect are within normal limits.  A focused examination was performed including right posterior neck. Relevant physical exam findings are noted in the Assessment and Plan.   Assessment & Plan   Encounter for Removal of Sutures - Incision site at the right posterior lateral base of neck is clean, dry and intact - Wound cleansed, sutures removed, wound cleansed and steri strips applied.  - Discussed pathology results showing no residual melanoma in situ margins free  - Patient advised to keep steri-strips dry until they fall off. - Scars remodel for a full year. - Once steri-strips fall off, patient can apply over-the-counter silicone scar cream each night to help with scar remodeling if desired. - Patient advised to call with any concerns or if they notice any new or changing lesions.  History of Melanoma in Situ Discussed bx results Margins Clear today at right posterior lateral base of neck.  - No evidence of recurrence today - Recommend regular full body skin exams - Recommend daily broad spectrum sunscreen SPF 30+ to sun-exposed areas, reapply every 2 hours as needed.  - Call if any new or changing lesions are noted between office visits  Return for keep follow up as scheduled in August .  I, Ruthell Rummage, CMA,  am acting as scribe for Sarina Ser, MD.3 Documentation: I have reviewed the above documentation for accuracy and completeness, and I agree with the above.  Sarina Ser, MD

## 2022-02-05 ENCOUNTER — Encounter: Payer: Self-pay | Admitting: Dermatology

## 2022-02-14 ENCOUNTER — Encounter: Payer: Self-pay | Admitting: Dermatology

## 2022-02-24 ENCOUNTER — Encounter: Payer: Self-pay | Admitting: Family Medicine

## 2022-02-24 ENCOUNTER — Ambulatory Visit: Payer: BC Managed Care – PPO | Admitting: Family Medicine

## 2022-02-24 VITALS — BP 132/79 | HR 115 | Temp 97.8°F | Resp 16 | Wt 149.0 lb

## 2022-02-24 DIAGNOSIS — Z3201 Encounter for pregnancy test, result positive: Secondary | ICD-10-CM

## 2022-02-24 DIAGNOSIS — R350 Frequency of micturition: Secondary | ICD-10-CM | POA: Diagnosis not present

## 2022-02-24 LAB — POCT URINALYSIS DIPSTICK
Bilirubin, UA: NORMAL
Blood, UA: NEGATIVE
Glucose, UA: NEGATIVE
Ketones, UA: NORMAL
Leukocytes, UA: NEGATIVE
Nitrite, UA: NEGATIVE
Protein, UA: NEGATIVE
Spec Grav, UA: 1.01 (ref 1.010–1.025)
Urobilinogen, UA: 0.2 E.U./dL
pH, UA: 7 (ref 5.0–8.0)

## 2022-02-24 LAB — POCT URINE PREGNANCY: Preg Test, Ur: POSITIVE — AB

## 2022-02-24 NOTE — Progress Notes (Signed)
I,Jana Robinson,acting as a scribe for Lelon Huh, MD.,have documented all relevant documentation on the behalf of Lelon Huh, MD,as directed by  Lelon Huh, MD while in the presence of Lelon Huh, MD.  Established patient visit   Patient: Latasha Cannon   DOB: 12-Jul-1981   41 y.o. Female  MRN: [id number] Visit Date: 02/24/2022  Today's healthcare provider: Lelon Huh, MD   No chief complaint on file.  Subjective    Patient presents for positive pregnancy test.  LMP unsure.   She knows she missed her last period, but can't remember exactly when last LMP was. Also complains of frequent urination x 2 weeks.     Medications: Outpatient Medications Prior to Visit  Medication Sig   Cholecalciferol 25 MCG (1000 UT) tablet Take 2,000 Units by mouth daily.   citalopram (CELEXA) 20 MG tablet Take 1 tablet (20 mg total) by mouth daily.   levothyroxine (SYNTHROID) 100 MCG tablet Take 100 mcg by mouth daily before breakfast.   loratadine (CLARITIN) 10 MG tablet Take 10 mg by mouth.   naproxen sodium (ANAPROX) 220 MG tablet Take 220 mg by mouth as needed.   mupirocin ointment (BACTROBAN) 2 % Apply 1 Application topically daily. Qd to excision site   SUMAtriptan (IMITREX) 50 MG tablet Take 1 tablet (50 mg total) by mouth every 2 (two) hours as needed for migraine. May repeat in 2 hours if headache persists or recurs. (Patient not taking: Reported on 10/07/2021)   No facility-administered medications prior to visit.    Review of Systems  Constitutional:  Negative for appetite change, chills, fatigue and fever.  Respiratory:  Negative for chest tightness and shortness of breath.   Cardiovascular:  Negative for chest pain and palpitations.  Gastrointestinal:  Negative for abdominal pain, nausea and vomiting.  Neurological:  Negative for dizziness and weakness.       Objective    BP 132/79 (BP Location: Right Arm, Patient Position: Sitting, Cuff Size: Normal)   Pulse (!)  115   Temp 97.8 F (36.6 C) (Oral)   Resp 16   Wt 149 lb (67.6 kg)   SpO2 100%   BMI 25.58 kg/m    Physical Exam  General appearance: Well developed, well nourished female, cooperative and in no acute distress Head: Normocephalic, without obvious abnormality, atraumatic Respiratory: Respirations even and unlabored, normal respiratory rate Extremities: All extremities are intact.  Skin: Skin color, texture, turgor normal. No rashes seen  Psych: Appropriate mood and affect. Neurologic: Mental status: Alert, oriented to person, place, and time, thought content appropriate.   Results for orders placed or performed in visit on 02/24/22  POCT Urinalysis Dipstick  Result Value Ref Range   Color, UA     Clarity, UA     Glucose, UA Negative Negative   Bilirubin, UA normal    Ketones, UA normal    Spec Grav, UA 1.010 1.010 - 1.025   Blood, UA neg    pH, UA 7.0 5.0 - 8.0   Protein, UA Negative Negative   Urobilinogen, UA 0.2 0.2 or 1.0 E.U./dL   Nitrite, UA neg    Leukocytes, UA Negative Negative   Appearance     Odor    POCT urine pregnancy  Result Value Ref Range   Preg Test, Ur Positive (A) Negative    Assessment & Plan     1. Positive pregnancy test  - POCT Pregnancy, Urine  She is unsure of date of LMP but states  she has missed at least 1 period, so expect she may be at 6-8 weeks.   Advised to start taking Prenatal vitamins. She has been on citalopram for years for anxiety and counseled on potential risks versus benefits. She is going to go ahead and starting weaning down by taking just  1/2 tablet daily until she establishes with OB  2. Urinary frequency Likely related to pregnancy states. Normal u/a today       The entirety of the information documented in the History of Present Illness, Review of Systems and Physical Exam were personally obtained by me. Portions of this information were initially documented by the CMA and reviewed by me for thoroughness and  accuracy.     Lelon Huh, MD  [facility] [phone] (phone) [phone] (fax)  [place]

## 2022-02-28 ENCOUNTER — Encounter: Payer: Self-pay | Admitting: Family Medicine

## 2022-02-28 NOTE — Telephone Encounter (Signed)
I would recommend she go ahead and cut back to 1/2 tablet daily and continue that dose until her OB appointment.

## 2022-03-13 DIAGNOSIS — O09513 Supervision of elderly primigravida, third trimester: Secondary | ICD-10-CM | POA: Insufficient documentation

## 2022-03-19 ENCOUNTER — Ambulatory Visit: Payer: BC Managed Care – PPO | Admitting: Dermatology

## 2022-03-19 DIAGNOSIS — Z1283 Encounter for screening for malignant neoplasm of skin: Secondary | ICD-10-CM

## 2022-03-19 DIAGNOSIS — L578 Other skin changes due to chronic exposure to nonionizing radiation: Secondary | ICD-10-CM

## 2022-03-19 DIAGNOSIS — L821 Other seborrheic keratosis: Secondary | ICD-10-CM

## 2022-03-19 DIAGNOSIS — Z85820 Personal history of malignant melanoma of skin: Secondary | ICD-10-CM

## 2022-03-19 DIAGNOSIS — Z86018 Personal history of other benign neoplasm: Secondary | ICD-10-CM

## 2022-03-19 DIAGNOSIS — D229 Melanocytic nevi, unspecified: Secondary | ICD-10-CM

## 2022-03-19 DIAGNOSIS — D239 Other benign neoplasm of skin, unspecified: Secondary | ICD-10-CM

## 2022-03-19 DIAGNOSIS — Z86006 Personal history of melanoma in-situ: Secondary | ICD-10-CM

## 2022-03-19 DIAGNOSIS — L858 Other specified epidermal thickening: Secondary | ICD-10-CM

## 2022-03-19 DIAGNOSIS — L814 Other melanin hyperpigmentation: Secondary | ICD-10-CM

## 2022-03-19 DIAGNOSIS — C434 Malignant melanoma of scalp and neck: Secondary | ICD-10-CM

## 2022-03-19 DIAGNOSIS — D1800 Hemangioma unspecified site: Secondary | ICD-10-CM

## 2022-03-19 NOTE — Progress Notes (Signed)
Follow-Up Visit   Subjective  Latasha Cannon is a 41 y.o. female who presents for the following: Annual Exam (Hx MM x 2, dysplastic nevi ). The patient presents for Total-Body Skin Exam (TBSE) for skin cancer screening and mole check.  The patient has spots, moles and lesions to be evaluated, some may be new or changing and the patient has concerns that these could be cancer. Patient is pregnant.  The following portions of the chart were reviewed this encounter and updated as appropriate:   Tobacco  Allergies  Meds  Problems  Med Hx  Surg Hx  Fam Hx     Review of Systems:  No other skin or systemic complaints except as noted in HPI or Assessment and Plan.  Objective  Well appearing patient in no apparent distress; mood and affect are within normal limits.  A full examination was performed including scalp, head, eyes, ears, nose, lips, neck, chest, axillae, abdomen, back, buttocks, bilateral upper extremities, bilateral lower extremities, hands, feet, fingers, toes, fingernails, and toenails. All findings within normal limits unless otherwise noted below.   Assessment & Plan  Skin cancer screening  Actinic skin damage  Melanocytic nevus, unspecified location  Malignant melanoma of neck ([place])   Lentigines - Scattered tan macules - Due to sun exposure - Benign-appearing, observe - Recommend daily broad spectrum sunscreen SPF 30+ to sun-exposed areas, reapply every 2 hours as needed. - Call for any changes  Seborrheic Keratoses - Stuck-on, waxy, tan-brown papules and/or plaques  - Benign-appearing - Discussed benign etiology and prognosis. - Observe - Call for any changes  Melanocytic Nevi - Tan-brown and/or pink-flesh-colored symmetric macules and papules - Benign appearing on exam today - Observation - Call clinic for new or changing moles - Recommend daily use of broad spectrum spf 30+ sunscreen to sun-exposed areas.   Hemangiomas - Red papules - Discussed  benign nature - Observe - Call for any changes  Actinic Damage - Chronic condition, secondary to cumulative UV/sun exposure - diffuse scaly erythematous macules with underlying dyspigmentation - Recommend daily broad spectrum sunscreen SPF 30+ to sun-exposed areas, reapply every 2 hours as needed.  - Staying in the shade or wearing long sleeves, sun glasses (UVA+UVB protection) and wide brim hats (4-inch brim around the entire circumference of the hat) are also recommended for sun protection.  - Call for new or changing lesions.  History of Melanoma in Situ - No evidence of recurrence today - Recommend regular full body skin exams - Recommend daily broad spectrum sunscreen SPF 30+ to sun-exposed areas, reapply every 2 hours as needed.  - Call if any new or changing lesions are noted between office visits - No LAD   History of Dysplastic Nevi - No evidence of recurrence today - Recommend regular full body skin exams - Recommend daily broad spectrum sunscreen SPF 30+ to sun-exposed areas, reapply every 2 hours as needed.  - Call if any new or changing lesions are noted between office visits  Keratosis Pilaris - Tiny follicular keratotic papules - Benign. Genetic in nature. No cure. - Observe. - If desired, patient can use an emollient (moisturizer) containing ammonium lactate, urea or salicylic acid once a day to smooth the area  Skin cancer screening performed today.  Return in about 3 months (around 06/19/2022) for TBSE.  Luther Redo, CMA, am acting as scribe for Sarina Ser, MD . Documentation: I have reviewed the above documentation for accuracy and completeness, and I agree with the above.  Sarina Ser, MD

## 2022-03-19 NOTE — Patient Instructions (Addendum)
Recommend Serica moisturizing scar formula cream every night or [facility] brand or Mederma silicone scar sheet every night for the first year after a scar appears to help with scar remodeling if desired. Scars remodel on their own for a full year and will gradually improve in appearance over time.    Due to recent changes in healthcare laws, you may see results of your pathology and/or laboratory studies on MyChart before the doctors have had a chance to review them. We understand that in some cases there may be results that are confusing or concerning to you. Please understand that not all results are received at the same time and often the doctors may need to interpret multiple results in order to provide you with the best plan of care or course of treatment. Therefore, we ask that you please give us 2 business days to thoroughly review all your results before contacting the office for clarification. Should we see a critical lab result, you will be contacted sooner.   If You Need Anything After Your Visit  If you have any questions or concerns for your doctor, please call our main line at [phone] and press option 4 to reach your doctor's medical assistant. If no one answers, please leave a voicemail as directed and we will return your call as soon as possible. Messages left after 4 pm will be answered the following business day.   You may also send us a message via MyChart. We typically respond to MyChart messages within 1-2 business days.  For prescription refills, please ask your pharmacy to contact our office. Our fax number is [phone].  If you have an urgent issue when the clinic is closed that cannot wait until the next business day, you can page your doctor at the number below.    Please note that while we do our best to be available for urgent issues outside of office hours, we are not available 24/7.   If you have an urgent issue and are unable to reach us, you may choose to  seek medical care at your doctor's office, retail clinic, urgent care center, or emergency room.  If you have a medical emergency, please immediately call 911 or go to the emergency department.  Pager Numbers  - Dr. Kowalski: [phone]  - Dr. Moye: [phone]  - Dr. Stewart: [phone]  In the event of inclement weather, please call our main line at [phone] for an update on the status of any delays or closures.  Dermatology Medication Tips: Please keep the boxes that topical medications come in in order to help keep track of the instructions about where and how to use these. Pharmacies typically print the medication instructions only on the boxes and not directly on the medication tubes.   If your medication is too expensive, please contact our office at [phone] option 4 or send us a message through MyChart.   We are unable to tell what your co-pay for medications will be in advance as this is different depending on your insurance coverage. However, we may be able to find a substitute medication at lower cost or fill out paperwork to get insurance to cover a needed medication.   If a prior authorization is required to get your medication covered by your insurance company, please allow us 1-2 business days to complete this process.  Drug prices often vary depending on where the prescription is filled and some pharmacies may offer cheaper prices.  The website www.goodrx.com contains coupons for medications   through different pharmacies. The prices here do not account for what the cost may be with help from insurance (it may be cheaper with your insurance), but the website can give you the price if you did not use any insurance.  - You can print the associated coupon and take it with your prescription to the pharmacy.  - You may also stop by our office during regular business hours and pick up a GoodRx coupon card.  - If you need your prescription sent electronically to a  different pharmacy, notify our office through [facility] MyChart or by phone at [phone] option 4.     Si Usted Necesita Algo Despus de Su Visita  Tambin puede enviarnos un mensaje a travs de MyChart. Por lo general respondemos a los mensajes de MyChart en el transcurso de 1 a 2 das hbiles.  Para renovar recetas, por favor pida a su farmacia que se ponga en contacto con nuestra oficina. Nuestro nmero de fax es el [phone].  Si tiene un asunto urgente cuando la clnica est cerrada y que no puede esperar hasta el siguiente da hbil, puede llamar/localizar a su doctor(a) al nmero que aparece a continuacin.   Por favor, tenga en cuenta que aunque hacemos todo lo posible para estar disponibles para asuntos urgentes fuera del horario de oficina, no estamos disponibles las 24 horas del da, los 7 das de la semana.   Si tiene un problema urgente y no puede comunicarse con nosotros, puede optar por buscar atencin mdica  en el consultorio de su doctor(a), en una clnica privada, en un centro de atencin urgente o en una sala de emergencias.  Si tiene una emergencia mdica, por favor llame inmediatamente al 911 o vaya a la sala de emergencias.  Nmeros de bper  - Dr. Kowalski: [phone]  - Dra. Moye: [phone]  - Dra. Stewart: [phone]  En caso de inclemencias del tiempo, por favor llame a nuestra lnea principal al [phone] para una actualizacin sobre el estado de cualquier retraso o cierre.  Consejos para la medicacin en dermatologa: Por favor, guarde las cajas en las que vienen los medicamentos de uso tpico para ayudarle a seguir las instrucciones sobre dnde y cmo usarlos. Las farmacias generalmente imprimen las instrucciones del medicamento slo en las cajas y no directamente en los tubos del medicamento.   Si su medicamento es muy caro, por favor, pngase en contacto con nuestra oficina llamando al [phone] y presione la opcin 4 o envenos un  mensaje a travs de MyChart.   No podemos decirle cul ser su copago por los medicamentos por adelantado ya que esto es diferente dependiendo de la cobertura de su seguro. Sin embargo, es posible que podamos encontrar un medicamento sustituto a menor costo o llenar un formulario para que el seguro cubra el medicamento que se considera necesario.   Si se requiere una autorizacin previa para que su compaa de seguros cubra su medicamento, por favor permtanos de 1 a 2 das hbiles para completar este proceso.  Los precios de los medicamentos varan con frecuencia dependiendo del lugar de dnde se surte la receta y alguna farmacias pueden ofrecer precios ms baratos.  El sitio web www.goodrx.com tiene cupones para medicamentos de diferentes farmacias. Los precios aqu no tienen en cuenta lo que podra costar con la ayuda del seguro (puede ser ms barato con su seguro), pero el sitio web puede darle el precio si no utiliz ningn seguro.  - Puede imprimir el cupn correspondiente y llevarlo   con su receta a la farmacia.  - Tambin puede pasar por nuestra oficina durante el horario de atencin regular y recoger una tarjeta de cupones de GoodRx.  - Si necesita que su receta se enve electrnicamente a una farmacia diferente, informe a nuestra oficina a travs de MyChart de [facility] o por telfono llamando al [phone] y presione la opcin 4.  

## 2022-03-20 ENCOUNTER — Encounter: Payer: BC Managed Care – PPO | Admitting: Family Medicine

## 2022-04-07 LAB — OB RESULTS CONSOLE ABO/RH: RH Type: POSITIVE

## 2022-04-07 LAB — OB RESULTS CONSOLE HEPATITIS B SURFACE ANTIGEN: Hepatitis B Surface Ag: NEGATIVE

## 2022-04-07 LAB — OB RESULTS CONSOLE VARICELLA ZOSTER ANTIBODY, IGG: Varicella: NON-IMMUNE/NOT IMMUNE

## 2022-04-07 LAB — OB RESULTS CONSOLE ANTIBODY SCREEN: Antibody Screen: NEGATIVE

## 2022-04-07 LAB — OB RESULTS CONSOLE RUBELLA ANTIBODY, IGM: Rubella: IMMUNE

## 2022-04-07 LAB — OB RESULTS CONSOLE RPR: RPR: NONREACTIVE

## 2022-04-09 ENCOUNTER — Ambulatory Visit (INDEPENDENT_AMBULATORY_CARE_PROVIDER_SITE_OTHER): Payer: BC Managed Care – PPO

## 2022-04-09 DIAGNOSIS — Z23 Encounter for immunization: Secondary | ICD-10-CM

## 2022-04-10 DIAGNOSIS — O09899 Supervision of other high risk pregnancies, unspecified trimester: Secondary | ICD-10-CM | POA: Insufficient documentation

## 2022-05-06 LAB — OB RESULTS CONSOLE GC/CHLAMYDIA
Chlamydia: NEGATIVE
Neisseria Gonorrhea: NEGATIVE

## 2022-06-18 ENCOUNTER — Ambulatory Visit: Payer: BC Managed Care – PPO | Admitting: Dermatology

## 2022-06-18 VITALS — BP 109/78 | HR 117

## 2022-06-18 DIAGNOSIS — L814 Other melanin hyperpigmentation: Secondary | ICD-10-CM | POA: Diagnosis not present

## 2022-06-18 DIAGNOSIS — D229 Melanocytic nevi, unspecified: Secondary | ICD-10-CM

## 2022-06-18 DIAGNOSIS — L578 Other skin changes due to chronic exposure to nonionizing radiation: Secondary | ICD-10-CM

## 2022-06-18 DIAGNOSIS — Z86006 Personal history of melanoma in-situ: Secondary | ICD-10-CM

## 2022-06-18 DIAGNOSIS — Z1283 Encounter for screening for malignant neoplasm of skin: Secondary | ICD-10-CM | POA: Diagnosis not present

## 2022-06-18 DIAGNOSIS — L821 Other seborrheic keratosis: Secondary | ICD-10-CM

## 2022-06-18 DIAGNOSIS — Z85820 Personal history of malignant melanoma of skin: Secondary | ICD-10-CM

## 2022-06-18 NOTE — Patient Instructions (Signed)
Due to recent changes in healthcare laws, you may see results of your pathology and/or laboratory studies on MyChart before the doctors have had a chance to review them. We understand that in some cases there may be results that are confusing or concerning to you. Please understand that not all results are received at the same time and often the doctors may need to interpret multiple results in order to provide you with the best plan of care or course of treatment. Therefore, we ask that you please give us 2 business days to thoroughly review all your results before contacting the office for clarification. Should we see a critical lab result, you will be contacted sooner.   If You Need Anything After Your Visit  If you have any questions or concerns for your doctor, please call our main line at [phone] and press option 4 to reach your doctor's medical assistant. If no one answers, please leave a voicemail as directed and we will return your call as soon as possible. Messages left after 4 pm will be answered the following business day.   You may also send us a message via MyChart. We typically respond to MyChart messages within 1-2 business days.  For prescription refills, please ask your pharmacy to contact our office. Our fax number is [phone].  If you have an urgent issue when the clinic is closed that cannot wait until the next business day, you can page your doctor at the number below.    Please note that while we do our best to be available for urgent issues outside of office hours, we are not available 24/7.   If you have an urgent issue and are unable to reach us, you may choose to seek medical care at your doctor's office, retail clinic, urgent care center, or emergency room.  If you have a medical emergency, please immediately call 911 or go to the emergency department.  Pager Numbers  - Dr. Kowalski: [phone]  - Dr. Moye: [phone]  - Dr. Stewart:  [phone]  In the event of inclement weather, please call our main line at [phone] for an update on the status of any delays or closures.  Dermatology Medication Tips: Please keep the boxes that topical medications come in in order to help keep track of the instructions about where and how to use these. Pharmacies typically print the medication instructions only on the boxes and not directly on the medication tubes.   If your medication is too expensive, please contact our office at [phone] option 4 or send us a message through MyChart.   We are unable to tell what your co-pay for medications will be in advance as this is different depending on your insurance coverage. However, we may be able to find a substitute medication at lower cost or fill out paperwork to get insurance to cover a needed medication.   If a prior authorization is required to get your medication covered by your insurance company, please allow us 1-2 business days to complete this process.  Drug prices often vary depending on where the prescription is filled and some pharmacies may offer cheaper prices.  The website www.goodrx.com contains coupons for medications through different pharmacies. The prices here do not account for what the cost may be with help from insurance (it may be cheaper with your insurance), but the website can give you the price if you did not use any insurance.  - You can print the associated coupon and take it with   your prescription to the pharmacy.  - You may also stop by our office during regular business hours and pick up a GoodRx coupon card.  - If you need your prescription sent electronically to a different pharmacy, notify our office through [facility] MyChart or by phone at [phone] option 4.     Si Usted Necesita Algo Despus de Su Visita  Tambin puede enviarnos un mensaje a travs de MyChart. Por lo general respondemos a los mensajes de MyChart en el transcurso de 1 a 2  das hbiles.  Para renovar recetas, por favor pida a su farmacia que se ponga en contacto con nuestra oficina. Nuestro nmero de fax es el [phone].  Si tiene un asunto urgente cuando la clnica est cerrada y que no puede esperar hasta el siguiente da hbil, puede llamar/localizar a su doctor(a) al nmero que aparece a continuacin.   Por favor, tenga en cuenta que aunque hacemos todo lo posible para estar disponibles para asuntos urgentes fuera del horario de oficina, no estamos disponibles las 24 horas del da, los 7 das de la semana.   Si tiene un problema urgente y no puede comunicarse con nosotros, puede optar por buscar atencin mdica  en el consultorio de su doctor(a), en una clnica privada, en un centro de atencin urgente o en una sala de emergencias.  Si tiene una emergencia mdica, por favor llame inmediatamente al 911 o vaya a la sala de emergencias.  Nmeros de bper  - Dr. Kowalski: [phone]  - Dra. Moye: [phone]  - Dra. Stewart: [phone]  En caso de inclemencias del tiempo, por favor llame a nuestra lnea principal al [phone] para una actualizacin sobre el estado de cualquier retraso o cierre.  Consejos para la medicacin en dermatologa: Por favor, guarde las cajas en las que vienen los medicamentos de uso tpico para ayudarle a seguir las instrucciones sobre dnde y cmo usarlos. Las farmacias generalmente imprimen las instrucciones del medicamento slo en las cajas y no directamente en los tubos del medicamento.   Si su medicamento es muy caro, por favor, pngase en contacto con nuestra oficina llamando al [phone] y presione la opcin 4 o envenos un mensaje a travs de MyChart.   No podemos decirle cul ser su copago por los medicamentos por adelantado ya que esto es diferente dependiendo de la cobertura de su seguro. Sin embargo, es posible que podamos encontrar un medicamento sustituto a menor costo o llenar un formulario para que el  seguro cubra el medicamento que se considera necesario.   Si se requiere una autorizacin previa para que su compaa de seguros cubra su medicamento, por favor permtanos de 1 a 2 das hbiles para completar este proceso.  Los precios de los medicamentos varan con frecuencia dependiendo del lugar de dnde se surte la receta y alguna farmacias pueden ofrecer precios ms baratos.  El sitio web www.goodrx.com tiene cupones para medicamentos de diferentes farmacias. Los precios aqu no tienen en cuenta lo que podra costar con la ayuda del seguro (puede ser ms barato con su seguro), pero el sitio web puede darle el precio si no utiliz ningn seguro.  - Puede imprimir el cupn correspondiente y llevarlo con su receta a la farmacia.  - Tambin puede pasar por nuestra oficina durante el horario de atencin regular y recoger una tarjeta de cupones de GoodRx.  - Si necesita que su receta se enve electrnicamente a una farmacia diferente, informe a nuestra oficina a travs de MyChart de [facility]   o por telfono llamando al [phone] y presione la opcin 4.  

## 2022-06-18 NOTE — Progress Notes (Signed)
   Follow-Up Visit   Subjective  Latasha Cannon is a 41 y.o. female who presents for the following: Annual Exam (History of Melanoma in situ and dysplastic nevi - The patient presents for Total-Body Skin Exam (TBSE) for skin cancer screening and mole check.  The patient has spots, moles and lesions to be evaluated, some may be new or changing and the patient has concerns that these could be cancer. ([redacted] weeks pregnant)/).  The following portions of the chart were reviewed this encounter and updated as appropriate:   Tobacco  Allergies  Meds  Problems  Med Hx  Surg Hx  Fam Hx     Review of Systems:  No other skin or systemic complaints except as noted in HPI or Assessment and Plan.  Objective  Well appearing patient in no apparent distress; mood and affect are within normal limits.  A full examination was performed including scalp, head, eyes, ears, nose, lips, neck, chest, axillae, abdomen, back, buttocks, bilateral upper extremities, bilateral lower extremities, hands, feet, fingers, toes, fingernails, and toenails. All findings within normal limits unless otherwise noted below.  Right post lat base of neck, right prox dorsum med great toe Well healed excision site   Assessment & Plan   Lentigines - Scattered tan macules - Due to sun exposure - Benign-appearing, observe - Recommend daily broad spectrum sunscreen SPF 30+ to sun-exposed areas, reapply every 2 hours as needed. - Call for any changes  Seborrheic Keratoses - Stuck-on, waxy, tan-brown papules and/or plaques  - Benign-appearing - Discussed benign etiology and prognosis. - Observe - Call for any changes  Melanocytic Nevi - Tan-brown and/or pink-flesh-colored symmetric macules and papules - Benign appearing on exam today - Observation - Call clinic for new or changing moles - Recommend daily use of broad spectrum spf 30+ sunscreen to sun-exposed areas.   Hemangiomas - Red papules - Discussed benign nature -  Observe - Call for any changes  Actinic Damage - Chronic condition, secondary to cumulative UV/sun exposure - diffuse scaly erythematous macules with underlying dyspigmentation - Recommend daily broad spectrum sunscreen SPF 30+ to sun-exposed areas, reapply every 2 hours as needed.  - Staying in the shade or wearing long sleeves, sun glasses (UVA+UVB protection) and wide brim hats (4-inch brim around the entire circumference of the hat) are also recommended for sun protection.  - Call for new or changing lesions.  Skin cancer screening performed today.  History of melanoma in situ Right post lat base of neck, right prox dorsum med great toe - No evidence of recurrence today.  No lymphadenopathy. - Recommend regular full body skin exams - Recommend daily broad spectrum sunscreen SPF 30+ to sun-exposed areas, reapply every 2 hours as needed.  - Call if any new or changing lesions are noted between office visits   Return in about 3 months (around 09/18/2022) for TBSE.  I, Ashok Cordia, CMA, am acting as scribe for Sarina Ser, MD . Documentation: I have reviewed the above documentation for accuracy and completeness, and I agree with the above.  Sarina Ser, MD

## 2022-06-27 ENCOUNTER — Encounter: Payer: Self-pay | Admitting: Family Medicine

## 2022-06-29 ENCOUNTER — Encounter: Payer: Self-pay | Admitting: Dermatology

## 2022-07-03 DIAGNOSIS — O99280 Endocrine, nutritional and metabolic diseases complicating pregnancy, unspecified trimester: Secondary | ICD-10-CM | POA: Insufficient documentation

## 2022-07-03 DIAGNOSIS — E039 Hypothyroidism, unspecified: Secondary | ICD-10-CM | POA: Insufficient documentation

## 2022-08-01 DIAGNOSIS — O99013 Anemia complicating pregnancy, third trimester: Secondary | ICD-10-CM | POA: Insufficient documentation

## 2022-09-10 ENCOUNTER — Encounter: Payer: Self-pay | Admitting: Family Medicine

## 2022-09-22 LAB — OB RESULTS CONSOLE GC/CHLAMYDIA
Chlamydia: NEGATIVE
Neisseria Gonorrhea: NEGATIVE

## 2022-09-22 LAB — OB RESULTS CONSOLE RPR: RPR: NONREACTIVE

## 2022-09-22 LAB — OB RESULTS CONSOLE HIV ANTIBODY (ROUTINE TESTING): HIV: NONREACTIVE

## 2022-09-22 LAB — OB RESULTS CONSOLE GBS: GBS: NEGATIVE

## 2022-09-24 ENCOUNTER — Ambulatory Visit: Payer: BC Managed Care – PPO | Admitting: Dermatology

## 2022-09-24 VITALS — BP 126/84 | HR 115

## 2022-09-24 DIAGNOSIS — L821 Other seborrheic keratosis: Secondary | ICD-10-CM

## 2022-09-24 DIAGNOSIS — L814 Other melanin hyperpigmentation: Secondary | ICD-10-CM

## 2022-09-24 DIAGNOSIS — Z1283 Encounter for screening for malignant neoplasm of skin: Secondary | ICD-10-CM | POA: Diagnosis not present

## 2022-09-24 DIAGNOSIS — D229 Melanocytic nevi, unspecified: Secondary | ICD-10-CM

## 2022-09-24 DIAGNOSIS — Z86018 Personal history of other benign neoplasm: Secondary | ICD-10-CM

## 2022-09-24 DIAGNOSIS — L578 Other skin changes due to chronic exposure to nonionizing radiation: Secondary | ICD-10-CM | POA: Diagnosis not present

## 2022-09-24 DIAGNOSIS — D2272 Melanocytic nevi of left lower limb, including hip: Secondary | ICD-10-CM

## 2022-09-24 DIAGNOSIS — Z86006 Personal history of melanoma in-situ: Secondary | ICD-10-CM | POA: Diagnosis not present

## 2022-09-24 DIAGNOSIS — Z85820 Personal history of malignant melanoma of skin: Secondary | ICD-10-CM

## 2022-09-24 NOTE — Patient Instructions (Signed)
Due to recent changes in healthcare laws, you may see results of your pathology and/or laboratory studies on MyChart before the doctors have had a chance to review them. We understand that in some cases there may be results that are confusing or concerning to you. Please understand that not all results are received at the same time and often the doctors may need to interpret multiple results in order to provide you with the best plan of care or course of treatment. Therefore, we ask that you please give us 2 business days to thoroughly review all your results before contacting the office for clarification. Should we see a critical lab result, you will be contacted sooner.   If You Need Anything After Your Visit  If you have any questions or concerns for your doctor, please call our main line at [phone] and press option 4 to reach your doctor's medical assistant. If no one answers, please leave a voicemail as directed and we will return your call as soon as possible. Messages left after 4 pm will be answered the following business day.   You may also send us a message via MyChart. We typically respond to MyChart messages within 1-2 business days.  For prescription refills, please ask your pharmacy to contact our office. Our fax number is [phone].  If you have an urgent issue when the clinic is closed that cannot wait until the next business day, you can page your doctor at the number below.    Please note that while we do our best to be available for urgent issues outside of office hours, we are not available 24/7.   If you have an urgent issue and are unable to reach us, you may choose to seek medical care at your doctor's office, retail clinic, urgent care center, or emergency room.  If you have a medical emergency, please immediately call 911 or go to the emergency department.  Pager Numbers  - Dr. Kowalski: [phone]  - Dr. Moye: [phone]  - Dr. Stewart:  [phone]  In the event of inclement weather, please call our main line at [phone] for an update on the status of any delays or closures.  Dermatology Medication Tips: Please keep the boxes that topical medications come in in order to help keep track of the instructions about where and how to use these. Pharmacies typically print the medication instructions only on the boxes and not directly on the medication tubes.   If your medication is too expensive, please contact our office at [phone] option 4 or send us a message through MyChart.   We are unable to tell what your co-pay for medications will be in advance as this is different depending on your insurance coverage. However, we may be able to find a substitute medication at lower cost or fill out paperwork to get insurance to cover a needed medication.   If a prior authorization is required to get your medication covered by your insurance company, please allow us 1-2 business days to complete this process.  Drug prices often vary depending on where the prescription is filled and some pharmacies may offer cheaper prices.  The website www.goodrx.com contains coupons for medications through different pharmacies. The prices here do not account for what the cost may be with help from insurance (it may be cheaper with your insurance), but the website can give you the price if you did not use any insurance.  - You can print the associated coupon and take it with   your prescription to the pharmacy.  - You may also stop by our office during regular business hours and pick up a GoodRx coupon card.  - If you need your prescription sent electronically to a different pharmacy, notify our office through [facility] MyChart or by phone at [phone] option 4.     Si Usted Necesita Algo Despus de Su Visita  Tambin puede enviarnos un mensaje a travs de MyChart. Por lo general respondemos a los mensajes de MyChart en el transcurso de 1 a 2  das hbiles.  Para renovar recetas, por favor pida a su farmacia que se ponga en contacto con nuestra oficina. Nuestro nmero de fax es el [phone].  Si tiene un asunto urgente cuando la clnica est cerrada y que no puede esperar hasta el siguiente da hbil, puede llamar/localizar a su doctor(a) al nmero que aparece a continuacin.   Por favor, tenga en cuenta que aunque hacemos todo lo posible para estar disponibles para asuntos urgentes fuera del horario de oficina, no estamos disponibles las 24 horas del da, los 7 das de la semana.   Si tiene un problema urgente y no puede comunicarse con nosotros, puede optar por buscar atencin mdica  en el consultorio de su doctor(a), en una clnica privada, en un centro de atencin urgente o en una sala de emergencias.  Si tiene una emergencia mdica, por favor llame inmediatamente al 911 o vaya a la sala de emergencias.  Nmeros de bper  - Dr. Kowalski: [phone]  - Dra. Moye: [phone]  - Dra. Stewart: [phone]  En caso de inclemencias del tiempo, por favor llame a nuestra lnea principal al [phone] para una actualizacin sobre el estado de cualquier retraso o cierre.  Consejos para la medicacin en dermatologa: Por favor, guarde las cajas en las que vienen los medicamentos de uso tpico para ayudarle a seguir las instrucciones sobre dnde y cmo usarlos. Las farmacias generalmente imprimen las instrucciones del medicamento slo en las cajas y no directamente en los tubos del medicamento.   Si su medicamento es muy caro, por favor, pngase en contacto con nuestra oficina llamando al [phone] y presione la opcin 4 o envenos un mensaje a travs de MyChart.   No podemos decirle cul ser su copago por los medicamentos por adelantado ya que esto es diferente dependiendo de la cobertura de su seguro. Sin embargo, es posible que podamos encontrar un medicamento sustituto a menor costo o llenar un formulario para que el  seguro cubra el medicamento que se considera necesario.   Si se requiere una autorizacin previa para que su compaa de seguros cubra su medicamento, por favor permtanos de 1 a 2 das hbiles para completar este proceso.  Los precios de los medicamentos varan con frecuencia dependiendo del lugar de dnde se surte la receta y alguna farmacias pueden ofrecer precios ms baratos.  El sitio web www.goodrx.com tiene cupones para medicamentos de diferentes farmacias. Los precios aqu no tienen en cuenta lo que podra costar con la ayuda del seguro (puede ser ms barato con su seguro), pero el sitio web puede darle el precio si no utiliz ningn seguro.  - Puede imprimir el cupn correspondiente y llevarlo con su receta a la farmacia.  - Tambin puede pasar por nuestra oficina durante el horario de atencin regular y recoger una tarjeta de cupones de GoodRx.  - Si necesita que su receta se enve electrnicamente a una farmacia diferente, informe a nuestra oficina a travs de MyChart de [facility]   o por telfono llamando al [phone] y presione la opcin 4.  

## 2022-09-24 NOTE — Progress Notes (Signed)
Follow-Up Visit   Subjective  Latasha Cannon is a 42 y.o. female who presents for the following: Annual Exam (The patient presents for Total-Body Skin Exam (TBSE) for skin cancer screening and mole check.  The patient has spots, moles and lesions to be evaluated, some may be new or changing and the patient has concerns that these could be cancer. ).  Patient is [redacted] weeks pregnant   The following portions of the chart were reviewed this encounter and updated as appropriate:   Tobacco  Allergies  Meds  Problems  Med Hx  Surg Hx  Fam Hx     Review of Systems:  No other skin or systemic complaints except as noted in HPI or Assessment and Plan.  Objective  Well appearing patient in no apparent distress; mood and affect are within normal limits.  A full examination was performed including scalp, head, eyes, ears, nose, lips, neck, chest, axillae, abdomen, back, buttocks, bilateral upper extremities, bilateral lower extremities, hands, feet, fingers, toes, fingernails, and toenails. All findings within normal limits unless otherwise noted below.  left middle medial sole of foot 0.2 cm light brown macule    Assessment & Plan  Nevus left middle medial sole of foot Benign-appearing.  Observation.  Call clinic for new or changing lesions.  Recommend daily use of broad spectrum spf 30+ sunscreen to sun-exposed areas.    Lentigines - Scattered tan macules - Due to sun exposure - Benign-appearing, observe - Recommend daily broad spectrum sunscreen SPF 30+ to sun-exposed areas, reapply every 2 hours as needed. - Call for any changes  Seborrheic Keratoses - Stuck-on, waxy, tan-brown papules and/or plaques  - Benign-appearing - Discussed benign etiology and prognosis. - Observe - Call for any changes  Melanocytic Nevi - Tan-brown and/or pink-flesh-colored symmetric macules and papules - Benign appearing on exam today - Observation - Call clinic for new or changing moles -  Recommend daily use of broad spectrum spf 30+ sunscreen to sun-exposed areas.   Hemangiomas - Red papules - Discussed benign nature - Observe - Call for any changes  Actinic Damage - Chronic condition, secondary to cumulative UV/sun exposure - diffuse scaly erythematous macules with underlying dyspigmentation - Recommend daily broad spectrum sunscreen SPF 30+ to sun-exposed areas, reapply every 2 hours as needed.  - Staying in the shade or wearing long sleeves, sun glasses (UVA+UVB protection) and wide brim hats (4-inch brim around the entire circumference of the hat) are also recommended for sun protection.  - Call for new or changing lesions.  History of Dysplastic Nevi Multiple see history - No evidence of recurrence today - Recommend regular full body skin exams - Recommend daily broad spectrum sunscreen SPF 30+ to sun-exposed areas, reapply every 2 hours as needed.  - Call if any new or changing lesions are noted between office visits   History of Melanoma in Situ Right prox dorsum med great 10/16/2020 Right post lat base of neck  01/28/2022 - No evidence of recurrence today - Recommend regular full body skin exams - Recommend daily broad spectrum sunscreen SPF 30+ to sun-exposed areas, reapply every 2 hours as needed.  - Call if any new or changing lesions are noted between office visits   Skin cancer screening performed today.   Return in about 3 months (around 12/25/2022) for TBSE, hx of Melanoma in situ.  Marta Lamas, CMA, am acting as scribe for Sarina Ser, MD .  Documentation: I have reviewed the above documentation for accuracy and  completeness, and I agree with the above.  Sarina Ser, MD

## 2022-09-28 ENCOUNTER — Encounter: Payer: Self-pay | Admitting: Dermatology

## 2022-09-29 ENCOUNTER — Other Ambulatory Visit: Payer: Self-pay

## 2022-09-29 ENCOUNTER — Observation Stay
Admission: EM | Admit: 2022-09-29 | Discharge: 2022-09-29 | Disposition: A | Discharge status: Home / Self Care | Payer: BC Managed Care – PPO | Source: Home / Self Care | Admitting: Obstetrics and Gynecology

## 2022-09-29 DIAGNOSIS — O135 Gestational [pregnancy-induced] hypertension without significant proteinuria, complicating the puerperium: Secondary | ICD-10-CM | POA: Insufficient documentation

## 2022-09-29 DIAGNOSIS — Z3A37 37 weeks gestation of pregnancy: Secondary | ICD-10-CM | POA: Insufficient documentation

## 2022-09-29 DIAGNOSIS — O288 Other abnormal findings on antenatal screening of mother: Secondary | ICD-10-CM | POA: Diagnosis present

## 2022-09-29 LAB — COMPREHENSIVE METABOLIC PANEL
ALT: 15 U/L (ref 0–44)
AST: 21 U/L (ref 15–41)
Albumin: 2.9 g/dL — ABNORMAL LOW (ref 3.5–5.0)
Alkaline Phosphatase: 120 U/L (ref 38–126)
Anion gap: 6 (ref 5–15)
BUN: 10 mg/dL (ref 6–20)
CO2: 20 mmol/L — ABNORMAL LOW (ref 22–32)
Calcium: 9 mg/dL (ref 8.9–10.3)
Chloride: 110 mmol/L (ref 98–111)
Creatinine, Ser: 0.58 mg/dL (ref 0.44–1.00)
GFR, Estimated: >60 mL/min (ref >60)
Glucose, Bld: 84 mg/dL (ref 70–99)
Potassium: 4.1 mmol/L (ref 3.5–5.1)
Sodium: 136 mmol/L (ref 135–145)
Total Bilirubin: 0.4 mg/dL (ref 0.3–1.2)
Total Protein: 6.2 g/dL — ABNORMAL LOW (ref 6.5–8.1)

## 2022-09-29 LAB — CBC
HCT: 30.9 % — ABNORMAL LOW (ref 36.0–46.0)
Hemoglobin: 9.9 g/dL — ABNORMAL LOW (ref 12.0–15.0)
MCH: 26.7 pg (ref 26.0–34.0)
MCHC: 32 g/dL (ref 30.0–36.0)
MCV: 83.3 fL (ref 80.0–100.0)
Platelets: 280 10*3/uL (ref 150–400)
RBC: 3.71 MIL/uL — ABNORMAL LOW (ref 3.87–5.11)
RDW: 15 % (ref 11.5–15.5)
WBC: 8.8 10*3/uL (ref 4.0–10.5)
nRBC: 0 % (ref 0.0–0.2)

## 2022-09-29 LAB — PROTEIN / CREATININE RATIO, URINE
Creatinine, Urine: 32 mg/dL
Protein Creatinine Ratio: 0.28 mg/mg{Cre} — ABNORMAL HIGH (ref 0.00–0.15)
Total Protein, Urine: 9 mg/dL

## 2022-09-29 NOTE — OB Triage Note (Signed)
Patient is a G1P0 at 8w2dwho was sent over from office for non-reactive NST. Reports +FM and occasional ctx, denies LOF and vaginal bleeding. Initial FHT 140, external monitors applied and assessing. Initial BP 144/74, cycling q 153m. Denies HA, vision changes, and epigastric pain.

## 2022-09-29 NOTE — Discharge Summary (Signed)
Patient ID: Latasha Cannon MRN: [id number] DOB/AGE: April 27, 1981 41 y.o.  Admit date: 09/29/2022 Discharge date: 09/29/2022  Admission Diagnoses: 42yo G1P0 at 59w2dwas sent to triage from the office for a non-reactive NST.  Discharge Diagnoses: Gestational Hypertension  Factors complicating pregnancy: AMA  Anemia  Postsurgical hypothyroidism  Varicella non-immune Tachycardia Vaginismus  Anxiety   Prenatal Procedures: NST  Consults: None  Significant Diagnostic Studies:  Results for orders placed or performed during the hospital encounter of 09/29/22 (from the past 168 hour(s))  Protein / creatinine ratio, urine   Collection Time: 09/29/22 12:21 PM  Result Value Ref Range   Creatinine, Urine 32 mg/dL   Total Protein, Urine 9 mg/dL   Protein Creatinine Ratio 0.28 (H) 0.00 - 0.15 mg/mg[Cre]  CBC   Collection Time: 09/29/22 12:48 PM  Result Value Ref Range   WBC 8.8 4.0 - 10.5 K/uL   RBC 3.71 (L) 3.87 - 5.11 MIL/uL   Hemoglobin 9.9 (L) 12.0 - 15.0 g/dL   HCT 30.9 (L) 36.0 - 46.0 %   MCV 83.3 80.0 - 100.0 fL   MCH 26.7 26.0 - 34.0 pg   MCHC 32.0 30.0 - 36.0 g/dL   RDW 15.0 11.5 - 15.5 %   Platelets 280 150 - 400 K/uL   nRBC 0.0 0.0 - 0.2 %  Comprehensive metabolic panel   Collection Time: 09/29/22 12:48 PM  Result Value Ref Range   Sodium 136 135 - 145 mmol/L   Potassium 4.1 3.5 - 5.1 mmol/L   Chloride 110 98 - 111 mmol/L   CO2 20 (L) 22 - 32 mmol/L   Glucose, Bld 84 70 - 99 mg/dL   BUN 10 6 - 20 mg/dL   Creatinine, Ser 0.58 0.44 - 1.00 mg/dL   Calcium 9.0 8.9 - 10.3 mg/dL   Total Protein 6.2 (L) 6.5 - 8.1 g/dL   Albumin 2.9 (L) 3.5 - 5.0 g/dL   AST 21 15 - 41 U/L   ALT 15 0 - 44 U/L   Alkaline Phosphatase 120 38 - 126 U/L   Total Bilirubin 0.4 0.3 - 1.2 mg/dL   GFR, Estimated >60 >60 mL/min   Anion gap 6 5 - 15    Treatments: none  Hospital Course:  This is a 42y.o. G1P0 with IUP at 311w2dent from the office for non-reactive NST and an elevated BP.  No  leaking of fluid, no bleeding and no contractions.  NST was reactive and PIH labs were drawn (see results above). She was observed, fetal heart rate monitoring remained reassuring, and she had no signs/symptoms of  labor or other maternal-fetal concerns.   She was deemed stable for discharge to home with outpatient follow up.  Will plan to schedule her for an induction of labor for gestational hypertension.   Discharge Physical Exam:  BP 125/72   Pulse 95   Temp 98.4 F (36.9 C) (Oral)   Resp 18   Ht '5\' 3"'$  (1.6 m)   Wt 81.2 kg   BMI 31.71 kg/m   Vitals:   09/29/22 1116 09/29/22 1135 09/29/22 1150 09/29/22 1205  BP: (!) 144/74 131/82 128/78 122/76   09/29/22 1222 09/29/22 1237 09/29/22 1252 09/29/22 1307  BP: 130/70 126/73 127/77 132/67   09/29/22 1322 09/29/22 1337  BP: 128/72 125/72     General: NAD CV: RRR Pulm: CTABL, nl effort ABD: s/nd/nt, gravid DVT Evaluation: LE non-ttp, no evidence of DVT on exam.  NST: FHR baseline: 140 bpm Variability: moderate  Accelerations: yes Decelerations: none Category/reactivity: reactive  TOCO: quiet SVE: deferred      Discharge Condition: Stable  Disposition: Discharge disposition: 01-Home or Self Care        Allergies as of 09/29/2022       Reactions   Biaxin [clarithromycin]    Vision problems   Buspirone Other (See Comments)   tremors   Escitalopram    Eye Problems   Prednisone    Amoxicillin Rash   Ceclor [cefaclor] Rash   Codeine Rash        Medication List     TAKE these medications    calcium carbonate 500 MG chewable tablet Commonly known as: TUMS - dosed in mg elemental calcium Chew 1 tablet by mouth daily.   Cholecalciferol 25 MCG (1000 UT) tablet Take 2,000 Units by mouth daily.   citalopram 20 MG tablet Commonly known as: CELEXA Take 1 tablet (20 mg total) by mouth daily.   FIBER-CAPS PO Take by mouth.   levothyroxine 100 MCG tablet Commonly known as: SYNTHROID Take 100 mcg by mouth  daily before breakfast. Patient takes 150 mcg two days per week and 146mg on the other days   loratadine 10 MG tablet Commonly known as: CLARITIN Take 10 mg by mouth.   mupirocin ointment 2 % Commonly known as: BACTROBAN Apply 1 Application topically daily. Qd to excision site   naproxen sodium 220 MG tablet Commonly known as: ALEVE Take 220 mg by mouth as needed.   prenatal multivitamin Tabs tablet Take 1 tablet by mouth daily at 12 noon.   SUMAtriptan 50 MG tablet Commonly known as: Imitrex Take 1 tablet (50 mg total) by mouth every 2 (two) hours as needed for migraine. May repeat in 2 hours if headache persists or recurs.   VITAMIN B-6 PO Take by mouth.         Signed:Linda Hedges CNM 09/29/2022 2:23 PM

## 2022-09-29 NOTE — OB Triage Note (Signed)
Discharge instructions, labor precautions, and follow-up care reviewed with patient and significant other. All questions answered. Patient verbalized understanding. Discharged ambulatory off unit.   

## 2022-09-30 ENCOUNTER — Encounter: Payer: Self-pay | Admitting: Obstetrics and Gynecology

## 2022-09-30 ENCOUNTER — Other Ambulatory Visit: Payer: Self-pay

## 2022-09-30 ENCOUNTER — Observation Stay
Admission: EM | Admit: 2022-09-30 | Discharge: 2022-09-30 | Disposition: A | Discharge status: Home / Self Care | Payer: BC Managed Care – PPO | Source: Home / Self Care | Admitting: Obstetrics and Gynecology

## 2022-09-30 DIAGNOSIS — Z7982 Long term (current) use of aspirin: Secondary | ICD-10-CM | POA: Insufficient documentation

## 2022-09-30 DIAGNOSIS — Z79899 Other long term (current) drug therapy: Secondary | ICD-10-CM | POA: Insufficient documentation

## 2022-09-30 DIAGNOSIS — Z85820 Personal history of malignant melanoma of skin: Secondary | ICD-10-CM | POA: Insufficient documentation

## 2022-09-30 DIAGNOSIS — O133 Gestational [pregnancy-induced] hypertension without significant proteinuria, third trimester: Secondary | ICD-10-CM | POA: Insufficient documentation

## 2022-09-30 DIAGNOSIS — O09513 Supervision of elderly primigravida, third trimester: Secondary | ICD-10-CM | POA: Insufficient documentation

## 2022-09-30 DIAGNOSIS — E039 Hypothyroidism, unspecified: Secondary | ICD-10-CM | POA: Insufficient documentation

## 2022-09-30 DIAGNOSIS — O99283 Endocrine, nutritional and metabolic diseases complicating pregnancy, third trimester: Secondary | ICD-10-CM | POA: Insufficient documentation

## 2022-09-30 DIAGNOSIS — O163 Unspecified maternal hypertension, third trimester: Secondary | ICD-10-CM | POA: Diagnosis present

## 2022-09-30 MED ORDER — CALCIUM CARBONATE ANTACID 500 MG PO CHEW: 2.0000 | CHEWABLE_TABLET | ORAL | Status: DC | PRN

## 2022-09-30 MED ORDER — ACETAMINOPHEN 500 MG PO TABS: 1000.0000 mg | ORAL_TABLET | Freq: Four times a day (QID) | ORAL | Status: DC | PRN

## 2022-09-30 NOTE — Discharge Summary (Signed)
Latasha Cannon is a 42 y.o. female. She is at 66w3dgestation. No LMP recorded. Patient is pregnant. 10/18/2022, by Other Basis   [facility]  Chief complaint: elevated blood pressure at home.   HPI: [name] to L&D with complaints of elevated blood pressures at home. Reports BP's are 130-140/90's.  Denies HA, blurred vision, or RUQ pain.  Preeclampsia labs were completed yesterday and were normal.    Factors complicating pregnancy: AMA  Anemia  Postsurgical hypothyroidism  Varicella non-immune Tachycardia Vaginismus  Anxiety   S: Resting comfortably. no CTX, no VB.no LOF,  Active fetal movement.   Maternal Medical History:  Past Medical Hx:  has a past medical history of Atypical mole (01/31/2015), [place] [place]), Dysplastic nevus (05/08/2021), [place] ([place]), and [place] in [place] (11/13/[place]).    Past Surgical Hx:  has a past surgical history that includes Wisdom tooth extraction (2004) and Thyroidectomy (07/2013).   Allergies  Allergen Reactions   Biaxin [Clarithromycin]     Vision problems    Buspirone Other (See Comments)    tremors   Escitalopram     Eye Problems   Prednisone    Amoxicillin Rash   Ceclor [Cefaclor] Rash   Codeine Rash     Prior to Admission medications   Medication Sig Start Date End Date Taking? Authorizing Provider  aspirin 81 MG chewable tablet Chew 81 mg by mouth daily.   Yes [provider]  calcium carbonate (TUMS - DOSED IN MG ELEMENTAL CALCIUM) 500 MG chewable tablet Chew 1 tablet by mouth daily.   Yes [provider]  Calcium Polycarbophil (FIBER-CAPS PO) Take by mouth.   Yes [provider]  Cholecalciferol 25 MCG (1000 UT) tablet Take 2,000 Units by mouth daily.   Yes [provider]  citalopram (CELEXA) 20 MG tablet Take 1 tablet (20 mg total) by mouth daily. 03/18/21  Yes Bacigalupo, ADionne Bucy MD  ferrous sulfate 220 (44 Fe) MG/5ML  solution Take 220 mg by mouth daily.   Yes [provider]  levothyroxine (SYNTHROID) 100 MCG tablet Take 112 mcg by mouth daily before breakfast. Patient takes 150 mcg two days per week and 1056m on the other days   Yes [provider]  loratadine (CLARITIN) 10 MG tablet Take 10 mg by mouth.   Yes [provider]  Prenatal Vit-Fe Fumarate-FA (PRENATAL MULTIVITAMIN) TABS tablet Take 1 tablet by mouth daily at 12 noon.   Yes [provider]  mupirocin ointment (BACTROBAN) 2 % Apply 1 Application topically daily. Qd to excision site Patient not taking: Reported on 03/19/2022 01/28/22   KoRalene BatheMD  naproxen sodium (ANAPROX) 220 MG tablet Take 220 mg by mouth as needed. Patient not taking: Reported on 03/19/2022    [provider]  Pyridoxine HCl (VITAMIN B-6 PO) Take by mouth. Patient not taking: Reported on 09/30/2022    [provider]  SUMAtriptan (IMITREX) 50 MG tablet Take 1 tablet (50 mg total) by mouth every 2 (two) hours as needed for migraine. May repeat in 2 hours if headache persists or recurs. Patient not taking: Reported on 10/07/2021 03/07/20   BaVirginia CrewsMD    Social History: She  reports that she has never smoked. She has never used smokeless tobacco. She reports that she does not drink alcohol and does not use drugs.  Family History: family history includes Anxiety disorder in her mother; Breast cancer in an other family  member; Breast cancer (age of onset: 57) in her mother; Breast cancer (age of onset: 40) in her maternal grandmother; Breast cancer (age of onset: 64) in her paternal aunt; Healthy in her brother; Stroke (age of onset: 0) in her father; Uterine cancer in her maternal grandmother.  Review of Systems: A full review of systems was performed and negative except as noted in the HPI.     Pertinent Results:  Prenatal Labs: Blood type/Rh B pos  Antibody screen Negative    Rubella Immune    Varicella  Not immune  RPR NR    HBsAg NR   Hep C NR   HIV NR    GC neg  Chlamydia neg  Genetic screening cfDNA negative/AFP neg  1 hour GTT 109  3 hour GTT Neg  GBS Negative       O:  BP 129/82   Pulse (!) 103   Temp 98.7 F (37.1 C) (Oral)   Ht '5\' 3"'$  (1.6 m)   Wt 81.2 kg   BMI 31.71 kg/m  Vitals:   09/30/22 1105 09/30/22 1120 09/30/22 1135 09/30/22 1150  BP: 136/84 129/79 128/81 129/79   09/30/22 1205 09/30/22 1220  BP: 131/83 129/82    Results for orders placed or performed during the hospital encounter of 09/29/22 (from the past 48 hour(s))  Protein / creatinine ratio, urine   Collection Time: 09/29/22 12:21 PM  Result Value Ref Range   Creatinine, Urine 32 mg/dL   Total Protein, Urine 9 mg/dL   Protein Creatinine Ratio 0.28 (H) 0.00 - 0.15 mg/mg[Cre]  CBC   Collection Time: 09/29/22 12:48 PM  Result Value Ref Range   WBC 8.8 4.0 - 10.5 K/uL   RBC 3.71 (L) 3.87 - 5.11 MIL/uL   Hemoglobin 9.9 (L) 12.0 - 15.0 g/dL   HCT 30.9 (L) 36.0 - 46.0 %   MCV 83.3 80.0 - 100.0 fL   MCH 26.7 26.0 - 34.0 pg   MCHC 32.0 30.0 - 36.0 g/dL   RDW 15.0 11.5 - 15.5 %   Platelets 280 150 - 400 K/uL   nRBC 0.0 0.0 - 0.2 %  Comprehensive metabolic panel   Collection Time: 09/29/22 12:48 PM  Result Value Ref Range   Sodium 136 135 - 145 mmol/L   Potassium 4.1 3.5 - 5.1 mmol/L   Chloride 110 98 - 111 mmol/L   CO2 20 (L) 22 - 32 mmol/L   Glucose, Bld 84 70 - 99 mg/dL   BUN 10 6 - 20 mg/dL   Creatinine, Ser 0.58 0.44 - 1.00 mg/dL   Calcium 9.0 8.9 - 10.3 mg/dL   Total Protein 6.2 (L) 6.5 - 8.1 g/dL   Albumin 2.9 (L) 3.5 - 5.0 g/dL   AST 21 15 - 41 U/L   ALT 15 0 - 44 U/L   Alkaline Phosphatase 120 38 - 126 U/L   Total Bilirubin 0.4 0.3 - 1.2 mg/dL   GFR, Estimated >60 >60 mL/min   Anion gap 6 5 - 15     Constitutional: NAD, AAOx3  HE/ENT: extraocular movements grossly intact, moist mucous membranes CV: RRR PULM: nl respiratory effort Abd: gravid, non-tender, non-distended, soft   Ext: Non-tender, Nonedmeatous Psych: mood appropriate, speech normal Pelvic : deferred   NST: Baseline FHR: 135 beats/min Variability: moderate Accelerations: present Decelerations: absent Tocometry: None  Interpretation:  INDICATIONS: AMA, elevated blood pressure  RESULTS:  A NST procedure was performed with FHR monitoring and a normal baseline established, appropriate time of  20-40 minutes of evaluation, and accels >2 seen w 15x15 characteristics.  Results show a REACTIVE NST.   Assessment: 42 y.o. G1P0 62w3d3/30/2024, by Other Basis   Principle diagnosis: Elevated blood pressure affecting pregnancy in third trimester, antepartum [O16.3]   Plan: 1) Reactive NST  -Category 1 tracing  -Reassuring fetal status   2) Elevated blood pressure affecting pregnancy  -BP's all normal in OB triage  -PreE labs completed 09/29/2022 - WNL  -Discussed warning signs to return to L&D triage with  -Will schedule for BP check in office on Thursday, 10/02/2022   3) Disposition: discharge home stable -Precautions reviewed  -Follow up this Thursday, 10/02/2022, for blood pressure check    ----- ADrinda Butts CNM Certified Nurse Midwife [facility]

## 2022-09-30 NOTE — OB Triage Note (Signed)
Pt reports having elevated BPs @ home both last night and today. Latasha Cannon

## 2022-09-30 NOTE — OB Triage Note (Signed)
Discharge home. Left floor ambulatory with husband. .me

## 2022-09-30 NOTE — Discharge Instructions (Signed)
Induction of labor for [facility] at [facility]   Induction of labor scheduled for 10/11/2022 at 12:01 am (the midnight between Friday and Saturday night).    You have been scheduled for induction of labor on 10/11/2022 at 12:01 am (midnight).  Please call Labor and Delivery at [phone] an hour before your induction time (3/22 at 11PM) to make sure that they have bed availability for you.  Please be patient during this time as we cannot control how many babies want to have a birthday at the same time.     This information has been sent to your prenatal provider.  They can go over common induction methods and help answer questions.  You can also call L&D with questions prior to your induction.

## 2022-10-01 ENCOUNTER — Other Ambulatory Visit: Payer: Self-pay

## 2022-10-01 ENCOUNTER — Inpatient Hospital Stay
Admission: EM | Admit: 2022-10-01 | Discharge: 2022-10-05 | DRG: 786 | Disposition: A | Discharge status: Home / Self Care | Payer: BC Managed Care – PPO

## 2022-10-01 DIAGNOSIS — O4290 Premature rupture of membranes, unspecified as to length of time between rupture and onset of labor, unspecified weeks of gestation: Principal | ICD-10-CM | POA: Diagnosis present

## 2022-10-01 DIAGNOSIS — F419 Anxiety disorder, unspecified: Secondary | ICD-10-CM | POA: Diagnosis present

## 2022-10-01 DIAGNOSIS — Z86006 Personal history of melanoma in-situ: Secondary | ICD-10-CM

## 2022-10-01 DIAGNOSIS — O99013 Anemia complicating pregnancy, third trimester: Secondary | ICD-10-CM | POA: Diagnosis present

## 2022-10-01 DIAGNOSIS — O411230 Chorioamnionitis, third trimester, not applicable or unspecified: Secondary | ICD-10-CM | POA: Diagnosis present

## 2022-10-01 DIAGNOSIS — O9902 Anemia complicating childbirth: Secondary | ICD-10-CM | POA: Diagnosis present

## 2022-10-01 DIAGNOSIS — Z23 Encounter for immunization: Secondary | ICD-10-CM | POA: Diagnosis not present

## 2022-10-01 DIAGNOSIS — O4292 Full-term premature rupture of membranes, unspecified as to length of time between rupture and onset of labor: Secondary | ICD-10-CM | POA: Diagnosis present

## 2022-10-01 DIAGNOSIS — O99284 Endocrine, nutritional and metabolic diseases complicating childbirth: Secondary | ICD-10-CM | POA: Diagnosis present

## 2022-10-01 DIAGNOSIS — Z79890 Hormone replacement therapy (postmenopausal): Secondary | ICD-10-CM | POA: Diagnosis not present

## 2022-10-01 DIAGNOSIS — O134 Gestational [pregnancy-induced] hypertension without significant proteinuria, complicating childbirth: Principal | ICD-10-CM | POA: Diagnosis present

## 2022-10-01 DIAGNOSIS — Z3A37 37 weeks gestation of pregnancy: Secondary | ICD-10-CM | POA: Diagnosis not present

## 2022-10-01 DIAGNOSIS — E039 Hypothyroidism, unspecified: Secondary | ICD-10-CM | POA: Diagnosis present

## 2022-10-01 DIAGNOSIS — O411290 Chorioamnionitis, unspecified trimester, not applicable or unspecified: Secondary | ICD-10-CM | POA: Diagnosis not present

## 2022-10-01 DIAGNOSIS — Z85850 Personal history of malignant neoplasm of thyroid: Secondary | ICD-10-CM

## 2022-10-01 DIAGNOSIS — Z7982 Long term (current) use of aspirin: Secondary | ICD-10-CM

## 2022-10-01 DIAGNOSIS — O26893 Other specified pregnancy related conditions, third trimester: Secondary | ICD-10-CM | POA: Diagnosis present

## 2022-10-01 LAB — RUPTURE OF MEMBRANE (ROM)PLUS: Rom Plus: POSITIVE

## 2022-10-01 MED ORDER — ACETAMINOPHEN 325 MG PO TABS
650.0000 mg | ORAL_TABLET | ORAL | Status: DC | PRN
  Administered 2022-10-02 (×2): 650 mg via ORAL
  Filled 2022-10-01 (×2): qty 2

## 2022-10-01 MED ORDER — LIDOCAINE HCL (PF) 1 % IJ SOLN
30.0000 mL | INTRAMUSCULAR | Status: DC | PRN
  Filled 2022-10-01: qty 30

## 2022-10-01 MED ORDER — LACTATED RINGERS IV SOLN: INTRAVENOUS | Status: DC

## 2022-10-01 MED ORDER — OXYTOCIN BOLUS FROM INFUSION: 333.0000 mL | Freq: Once | INTRAVENOUS | Status: DC

## 2022-10-01 MED ORDER — TERBUTALINE SULFATE 1 MG/ML IJ SOLN
0.2500 mg | Freq: Once | INTRAMUSCULAR | Status: AC | PRN
  Administered 2022-10-02: 0.25 mg via SUBCUTANEOUS

## 2022-10-01 MED ORDER — OXYTOCIN-SODIUM CHLORIDE 30-0.9 UT/500ML-% IV SOLN
1.0000 m[IU]/min | INTRAVENOUS | Status: DC
  Administered 2022-10-02 (×2): 2 m[IU]/min via INTRAVENOUS
  Filled 2022-10-01 (×3): qty 500

## 2022-10-01 MED ORDER — SOD CITRATE-CITRIC ACID 500-334 MG/5ML PO SOLN
30.0000 mL | ORAL | Status: DC | PRN
  Administered 2022-10-02 – 2022-10-03 (×2): 30 mL via ORAL

## 2022-10-01 MED ORDER — OXYTOCIN-SODIUM CHLORIDE 30-0.9 UT/500ML-% IV SOLN
2.5000 [IU]/h | INTRAVENOUS | Status: DC
  Administered 2022-10-03: 30 [IU] via INTRAVENOUS

## 2022-10-01 MED ORDER — LACTATED RINGERS IV SOLN
500.0000 mL | INTRAVENOUS | Status: DC | PRN
  Administered 2022-10-02 (×2): 500 mL via INTRAVENOUS

## 2022-10-01 MED ORDER — ONDANSETRON HCL 4 MG/2ML IJ SOLN
4.0000 mg | Freq: Four times a day (QID) | INTRAMUSCULAR | Status: DC | PRN
  Administered 2022-10-02: 4 mg via INTRAVENOUS
  Filled 2022-10-01: qty 2

## 2022-10-01 NOTE — OB Triage Note (Signed)
Latasha Cannon 42 y.o. presents to Labor & Delivery triage via wheelchair steered by ED staff reporting Rupture of membranes around 800am and has continued to leak throughout the day. She has been feeling some mild cramps.  She is a G1P0 at [redacted]w[redacted]d. She denies signs active vaginal bleeding. She denies contractions and states positive fetal movement. External FM and TOCO applied to non-tender abdomen. Initial FHR 148. Vital signs obtained and within normal limits. Patient oriented to care environment including call bell and bed control use. [name], CNM notified of patient's arrival.

## 2022-10-01 NOTE — H&P (Signed)
OB History & Physical   History of Present Illness:   Chief Complaint: SROM  HPI:  Latasha Cannon is a 42 y.o. G1P0 female at 7w4dLMP 12/23/21, not consistent with [place]oreaat 854w4dwith Estimated Date of Delivery: 10/18/22.  She presents to L&D for SROM  Reports active fetal movement  Contractions: irregular cramping  LOF/SROM: 0800 Vaginal bleeding: denies  Factors complicating pregnancy:  AMA  Anemia  Postsurgical hypothyroidism  Varicella non-immune Tachycardia Vaginismus  Anxiety   Patient Active Problem List   Diagnosis Date Noted   Leakage of amniotic fluid 10/01/2022   Elevated blood pressure affecting pregnancy in third trimester, antepartum 09/30/2022   Non-reactive NST (non-stress test) 09/29/2022   Anemia affecting pregnancy in third trimester 08/01/2022   Hypothyroid in pregnancy, antepartum 07/03/2022   Susceptible to varicella (non-immune), currently pregnant 04/10/2022   Supervision of high risk elderly primigravida in third trimester 03/13/2022   Post-surgical hypothyroidism 03/18/2021   Avitaminosis D 02/24/2018   Family history of breast cancer 02/24/2018   Palpitations 01/11/2016   Tachycardia 01/11/2016   Menorrhagia 01/11/2016   Papillary thyroid carcinoma ([facility]/10/2014   Anxiety 02/22/2015   Dyspareunia, female 02/22/2015   Allergic rhinitis 02/21/2015   Adaptation reaction 02/21/2015   Adenitis, salivary, recurring 07/05/2014    Prenatal Transfer Tool  Maternal Diabetes: No Genetic Screening: Normal Maternal Ultrasounds/Referrals: Normal Fetal Ultrasounds or other Referrals:  None Maternal Substance Abuse:  No Significant Maternal Medications:  Meds include: Syntroid Other: Celexa Significant Maternal Lab Results: Group B Strep negative  Maternal Medical History:   Past Medical History:  Diagnosis Date   Atypical mole 01/31/2015   L dorsum middle toe med aspect shave removal 03/21/2015 - moderate to severe   Cancer ([facility]    thyroid stage 3    Dysplastic nevus 05/08/2021   L sup lat calf - mild   Melanoma in situ ([facility]/03/2021   R prox dorsum med great toe, excised 10/16/20   Melanoma in situ ([facility]04/26/2023   right post lat base of neck, Excised 01/28/2022    Past Surgical History:  Procedure Laterality Date   THYROIDECTOMY  07/2013   due to thyroid cancer   WISDOM TOOTH EXTRACTION  2004    Allergies  Allergen Reactions   Biaxin [Clarithromycin]     Vision problems    Buspirone Other (See Comments)    tremors   Escitalopram     Eye Problems   Prednisone    Amoxicillin Rash   Ceclor [Cefaclor] Rash   Codeine Rash    Prior to Admission medications   Medication Sig Start Date End Date Taking? Authorizing Provider  aspirin 81 MG chewable tablet Chew 81 mg by mouth daily.   Yes [provider]  Cholecalciferol 25 MCG (1000 UT) tablet Take 2,000 Units by mouth daily.   Yes [provider]  citalopram (CELEXA) 20 MG tablet Take 1 tablet (20 mg total) by mouth daily. 03/18/21  Yes Bacigalupo, AnDionne BucyMD  ferrous sulfate 220 (44 Fe) MG/5ML solution Take 220 mg by mouth daily.   Yes [provider]  levothyroxine (SYNTHROID) 100 MCG tablet Take 112 mcg by mouth daily before breakfast. Patient takes 150 mcg two days per week and 10057mon the other days   Yes [provider]  Prenatal Vit-Fe Fumarate-FA (PRENATAL MULTIVITAMIN) TABS tablet Take 1 tablet by mouth daily at 12 noon.   Yes [provider]  calcium carbonate (TUMS - DOSED IN MG ELEMENTAL CALCIUM)  500 MG chewable tablet Chew 1 tablet by mouth daily.    [provider]  Calcium Polycarbophil (FIBER-CAPS PO) Take by mouth.    [provider]  loratadine (CLARITIN) 10 MG tablet Take 10 mg by mouth.    [provider]     Prenatal care site:  [facility]  OB History  Gravida Para Term Preterm AB Living  1 0 0 0 0 0  SAB IAB Ectopic Multiple Live Births   0 0 0 0 0    # Outcome Date GA Lbr Len/2nd Weight Sex Delivery Anes PTL Lv  1 Current             Obstetric Comments  1st Menstrual Cycle:  13     Social History: She  reports that she has never smoked. She has never used smokeless tobacco. She reports that she does not drink alcohol and does not use drugs.  Family History: family history includes Anxiety disorder in her mother; Breast cancer in an other family member; Breast cancer (age of onset: 75) in her mother; Breast cancer (age of onset: 45) in her maternal grandmother; Breast cancer (age of onset: 8) in her paternal aunt; Healthy in her brother; Stroke (age of onset: 0) in her father; Uterine cancer in her maternal grandmother.   Review of Systems: A full review of systems was performed and negative except as noted in the HPI.     Physical Exam:  Vital Signs: BP 137/84 (BP Location: Left Arm)   Pulse (!) 103   Temp 98.8 F (37.1 C) (Oral)   Resp 19   Ht '5\' 3"'$  (1.6 m)   Wt 81.2 kg   BMI 31.71 kg/m   General: no acute distress.  HEENT: normocephalic, atraumatic Heart: regular rate & rhythm Lungs: normal respiratory effort Abdomen: soft, gravid, non-tender;  Pelvic:   External: Normal external female genitalia  Cervix:   /   /      Extremities: non-tender, symmetric, no edema bilaterally.  DTRs: +2  Neurologic: Alert & oriented x 3.    Results for orders placed or performed during the hospital encounter of 10/01/22 (from the past 24 hour(s))  ROM Plus ([facility] only)     Status: None   Collection Time: 10/01/22  9:48 PM  Result Value Ref Range   Rom Plus POSITIVE     Pertinent Results:  Prenatal Labs: Blood type/Rh B pos  Antibody screen Negative    Rubella Immune (09/18 0000)   Varicella Not immune  RPR Nonreactive (03/04 0000)   HBsAg Negative (09/18 0000)  Hep C NR   HIV Non-reactive (03/04 0000)   GC neg  Chlamydia neg  Genetic screening cfDNA negative   1 hour GTT 109  3 hour GTT N/a  GBS  Negative/-- (03/04 0000)    FHT:  FHR: 125 bpm, variability: moderate,  accelerations:  Present,  decelerations:  Absent Category/reactivity:  Category I UC:   irregular, every 3-5 minutes   Cephalic by Leopolds and SVE   No results found.  Assessment:  Latasha Cannon is a 42 y.o. G1P0 female at 29w4dwith SROM x 14 hours for clear fluid, AMA, hypothyroidism, tachycardia and anxiety  Plan:  1. Admit to Labor & Delivery - consents reviewed and obtained - Dr. JGlennon Macnotified of admission and plan of care   2. Fetal Well being  - Fetal Tracing: Category 1 - Group B Streptococcus ppx not indicated: GBS negative - Presentation: cephalic confirmed  by sve   3. Routine OB: - Prenatal labs reviewed, as above - Rh positive - CBC, T&S, RPR on admit - Clear liquid diet , continuous IV fluids  4. Monitoring of labor  - Contractions monitored with external toco - Pelvis  adequate for trial of labor  - Plan for expectant management  - Augmentation with oxytocin and cervical balloon as appropriate  - Plan for  continuous fetal monitoring - Maternal pain control as desired; planning regional anesthesia - Anticipate vaginal delivery -Plan on early epidural for vaginismus, unable to tolerate exams  5. Post Partum Planning: - Infant feeding: breast feeding - Contraception:  TBD - Tdap vaccine: Given prenatally - Flu vaccine: Given prenatally  Cromwell, CNM [id number] PM  Avelino Leeds, [place] Certified Nurse Midwife [place] [facility]

## 2022-10-02 ENCOUNTER — Inpatient Hospital Stay: Payer: BC Managed Care – PPO | Admitting: Anesthesiology

## 2022-10-02 ENCOUNTER — Encounter: Payer: Self-pay | Admitting: Obstetrics

## 2022-10-02 LAB — CBC
HCT: 32.6 % — ABNORMAL LOW (ref 36.0–46.0)
Hemoglobin: 10.4 g/dL — ABNORMAL LOW (ref 12.0–15.0)
MCH: 26.7 pg (ref 26.0–34.0)
MCHC: 31.9 g/dL (ref 30.0–36.0)
MCV: 83.8 fL (ref 80.0–100.0)
Platelets: 315 10*3/uL (ref 150–400)
RBC: 3.89 MIL/uL (ref 3.87–5.11)
RDW: 15.2 % (ref 11.5–15.5)
WBC: 9.6 10*3/uL (ref 4.0–10.5)
nRBC: 0 % (ref 0.0–0.2)

## 2022-10-02 LAB — RPR: RPR Ser Ql: NONREACTIVE

## 2022-10-02 LAB — TYPE AND SCREEN
ABO/RH(D): B POS
Antibody Screen: NEGATIVE

## 2022-10-02 LAB — ABO/RH: ABO/RH(D): B POS

## 2022-10-02 MED ORDER — BUPIVACAINE HCL (PF) 0.5 % IJ SOLN
INTRAMUSCULAR | Status: AC
  Filled 2022-10-02: qty 60

## 2022-10-02 MED ORDER — CITALOPRAM HYDROBROMIDE 20 MG PO TABS
20.0000 mg | ORAL_TABLET | Freq: Every day | ORAL | Status: DC
  Administered 2022-10-02 – 2022-10-05 (×4): 20 mg via ORAL
  Filled 2022-10-02 (×4): qty 1

## 2022-10-02 MED ORDER — LIDOCAINE-EPINEPHRINE (PF) 1.5 %-1:200000 IJ SOLN
INTRAMUSCULAR | Status: DC | PRN
  Administered 2022-10-02: 3 mL via EPIDURAL

## 2022-10-02 MED ORDER — EPHEDRINE 5 MG/ML INJ
INTRAVENOUS | Status: AC
  Filled 2022-10-02: qty 5

## 2022-10-02 MED ORDER — GENTAMICIN SULFATE 40 MG/ML IJ SOLN
5.0000 mg/kg | INTRAVENOUS | Status: DC
  Filled 2022-10-02: qty 8

## 2022-10-02 MED ORDER — CHLORHEXIDINE GLUCONATE 0.12 % MT SOLN
OROMUCOSAL | Status: AC
  Administered 2022-10-02: 15 mL
  Filled 2022-10-02: qty 15

## 2022-10-02 MED ORDER — MISOPROSTOL 200 MCG PO TABS
ORAL_TABLET | ORAL | Status: AC
  Filled 2022-10-02: qty 4

## 2022-10-02 MED ORDER — TERBUTALINE SULFATE 1 MG/ML IJ SOLN
INTRAMUSCULAR | Status: AC
  Filled 2022-10-02: qty 1

## 2022-10-02 MED ORDER — SODIUM CHLORIDE (PF) 0.9 % IJ SOLN
INTRAMUSCULAR | Status: AC
  Filled 2022-10-02: qty 50

## 2022-10-02 MED ORDER — OXYTOCIN 10 UNIT/ML IJ SOLN
INTRAMUSCULAR | Status: AC
  Filled 2022-10-02: qty 2

## 2022-10-02 MED ORDER — SOD CITRATE-CITRIC ACID 500-334 MG/5ML PO SOLN
ORAL | Status: AC
  Filled 2022-10-02: qty 15

## 2022-10-02 MED ORDER — SODIUM CHLORIDE 0.9 % IV SOLN
INTRAVENOUS | Status: AC
  Filled 2022-10-02: qty 5

## 2022-10-02 MED ORDER — FENTANYL-BUPIVACAINE-NACL 0.5-0.125-0.9 MG/250ML-% EP SOLN
12.0000 mL/h | EPIDURAL | Status: DC | PRN
  Administered 2022-10-02: 12 mL/h via EPIDURAL
  Filled 2022-10-02 (×2): qty 250

## 2022-10-02 MED ORDER — SODIUM BICARBONATE 8.4 % IV SOLN
INTRAVENOUS | Status: AC
  Filled 2022-10-02: qty 50

## 2022-10-02 MED ORDER — PHENYLEPHRINE 80 MCG/ML (10ML) SYRINGE FOR IV PUSH (FOR BLOOD PRESSURE SUPPORT)
PREFILLED_SYRINGE | INTRAVENOUS | Status: AC
  Filled 2022-10-02: qty 10

## 2022-10-02 MED ORDER — EPHEDRINE 5 MG/ML INJ: 10.0000 mg | INTRAVENOUS | Status: DC | PRN

## 2022-10-02 MED ORDER — DIPHENHYDRAMINE HCL 50 MG/ML IJ SOLN: 12.5000 mg | INTRAMUSCULAR | Status: DC | PRN

## 2022-10-02 MED ORDER — OXYTOCIN-SODIUM CHLORIDE 30-0.9 UT/500ML-% IV SOLN
INTRAVENOUS | Status: AC
  Filled 2022-10-02: qty 500

## 2022-10-02 MED ORDER — LACTATED RINGERS IV SOLN: 500.0000 mL | Freq: Once | INTRAVENOUS | Status: DC

## 2022-10-02 MED ORDER — LEVOTHYROXINE SODIUM 100 MCG PO TABS
100.0000 ug | ORAL_TABLET | Freq: Every day | ORAL | Status: DC
  Administered 2022-10-03 – 2022-10-05 (×3): 100 ug via ORAL
  Filled 2022-10-02 (×3): qty 1

## 2022-10-02 MED ORDER — LIDOCAINE HCL 2 % IJ SOLN
INTRAMUSCULAR | Status: AC
  Filled 2022-10-02: qty 20

## 2022-10-02 MED ORDER — AMMONIA AROMATIC IN INHA
RESPIRATORY_TRACT | Status: AC
  Filled 2022-10-02: qty 10

## 2022-10-02 MED ORDER — LIDOCAINE HCL (PF) 1 % IJ SOLN
INTRAMUSCULAR | Status: DC | PRN
  Administered 2022-10-02: 3 mL via EPIDURAL

## 2022-10-02 MED ORDER — FENTANYL CITRATE (PF) 100 MCG/2ML IJ SOLN
INTRAMUSCULAR | Status: AC
  Filled 2022-10-02: qty 2

## 2022-10-02 MED ORDER — GENTAMICIN SULFATE 40 MG/ML IJ SOLN
5.0000 mg/kg | INTRAVENOUS | Status: DC
  Administered 2022-10-03 (×2): 320 mg via INTRAVENOUS
  Filled 2022-10-02 (×2): qty 8

## 2022-10-02 MED ORDER — PHENYLEPHRINE 80 MCG/ML (10ML) SYRINGE FOR IV PUSH (FOR BLOOD PRESSURE SUPPORT): 80.0000 ug | PREFILLED_SYRINGE | INTRAVENOUS | Status: DC | PRN

## 2022-10-02 MED ORDER — SUCCINYLCHOLINE CHLORIDE 200 MG/10ML IV SOSY
PREFILLED_SYRINGE | INTRAVENOUS | Status: AC
  Filled 2022-10-02: qty 10

## 2022-10-02 MED ORDER — CLINDAMYCIN PHOSPHATE 900 MG/50ML IV SOLN
900.0000 mg | Freq: Once | INTRAVENOUS | Status: DC
  Filled 2022-10-02: qty 50

## 2022-10-02 MED ORDER — SODIUM CHLORIDE 0.9 % IV SOLN
INTRAVENOUS | Status: DC | PRN
  Administered 2022-10-02: 5 mL via EPIDURAL

## 2022-10-02 MED ORDER — VANCOMYCIN HCL IN DEXTROSE 1-5 GM/200ML-% IV SOLN
1000.0000 mg | Freq: Two times a day (BID) | INTRAVENOUS | Status: DC
  Administered 2022-10-02: 1000 mg via INTRAVENOUS
  Filled 2022-10-02: qty 200

## 2022-10-02 MED ORDER — CEFAZOLIN SODIUM-DEXTROSE 2-4 GM/100ML-% IV SOLN
INTRAVENOUS | Status: AC
  Filled 2022-10-02: qty 100

## 2022-10-02 MED ORDER — LACTATED RINGERS AMNIOINFUSION
INTRAVENOUS | Status: DC
  Administered 2022-10-03: 500 mL via INTRAUTERINE
  Filled 2022-10-02 (×4): qty 1000

## 2022-10-02 NOTE — Progress Notes (Signed)
Labor Progress Note  Latasha Cannon is a 42 y.o. G1P0 at 50w5dby LMP admitted for PROM  Subjective: comfortable.   CNM to room for prolonged decel, nursing at bedside, Pitocin DC, IV bolus started, position changed multiple times. .     Fetal Assessment: FHR prolonged decel to 40bpm, lasting total 623m, baseline prior to decel 140bpm with mod var, + accels no decels.  Category/reactivity:  Category II UC:   regular, every 2-3 minutes; Pit at 1838min prior to prolonged decel SVE:   8/90/0; bloody show noted.  SROM, 36hrs Amniotic color: clear  Labs: Lab Results  Component Value Date   WBC 9.6 10/01/2022   HGB 10.4 (L) 10/01/2022   HCT 32.6 (L) 10/01/2022   MCV 83.8 10/01/2022   PLT 315 10/01/2022    Assessment / Plan: Augmentation of labor, progressing well  Labor: active labor with cervical change, Dr SchOuida Sillslled by nursing during prolonged decel, pt counseled and prepped for CS, but with FHR recovery and cervical change, will continue to monitor.  - Dr SchOuida Sillsesent and aware   RebFrancetta FoundNMBelfry14/2024, 8:04 PM

## 2022-10-02 NOTE — Progress Notes (Signed)
Patient ID: Latasha Cannon, female   DOB: [birth year], 42 y.o.   MRN: [id number] I have been kept abreast of decels and inutero resuscitation . Amnioinfusion on going . Some fetal tachycardia currently . Mother afebrile , now ruptured 28+ hrs . Recheck cx at 1400  and if no change I will speak to her about primary LTCS  Monitor for Chorio

## 2022-10-02 NOTE — Progress Notes (Signed)
Labor Progress Note  Latasha Cannon is a 42 y.o. G1P0 at 60w5dby ultrasound admitted for PROM x 27hrs; ROM 3/13 at 0800.   Subjective: comfortable with epidural   Objective:  Fetal Assessment: FHT:  FHR: 145 bpm, variability: min-mod,  accelerations:  Abscent,  decelerations:  Present occasional variable  Category/reactivity:  Category II UC:   regular, every 3-4 minutes; pitocin currently at 132mmin; MVUs remain inadequate.  SVE:   6/90/-1; anterior, soft.  - amnioinfusion stopped.   Membrane status: PROM 10/01/22 at 0800 Amniotic color: clear  Labs: Lab Results  Component Value Date   WBC 9.6 10/01/2022   HGB 10.4 (L) 10/01/2022   HCT 32.6 (L) 10/01/2022   MCV 83.8 10/01/2022   PLT 315 10/01/2022    Assessment / Plan: Augmentation of labor, progressing G1P0 at 3781w5dROM x 33hrs  Labor: started Pitocin at 0333; restarted at 1230; Cervical change. Dr SchOuida Sillstified update.  Preeclampsia:  no e/o Pre-E Fetal Wellbeing:  Category II Pain Control:  Epidural placed early due to pain control and inability to examine pt r/t hx vaginismus I/D:  GBS neg   RebFrancetta FoundNM 10/02/2022, 5:11 PM

## 2022-10-02 NOTE — Progress Notes (Signed)
Notified by nursing of maternal fever, Temp 100.5  FHR 165bpm, mod var, no accels, no decels TOCO q1.5-57mn, IUPC in place, MVUs 70-100  D/W Dr SOuida Sills VDeniece Reeand GFort Leonard Woodordered due to pt allergy for Chorio, Pitocin to be restarted.    RFrancetta Found CNM 10/02/2022 9:33 PM

## 2022-10-02 NOTE — Anesthesia Preprocedure Evaluation (Addendum)
Anesthesia Evaluation  Patient identified by MRN, date of birth, ID band Patient awake    Reviewed: Allergy & Precautions, NPO status , Patient's Chart, lab work & pertinent test results  History of Anesthesia Complications Negative for: history of anesthetic complications  Airway Mallampati: III  TM Distance: >3 FB Neck ROM: Full    Dental no notable dental hx. (+) Teeth Intact   Pulmonary neg pulmonary ROS, neg sleep apnea, neg COPD, Patient abstained from smoking.Not current smoker   Pulmonary exam normal breath sounds clear to auscultation       Cardiovascular Exercise Tolerance: Good METS(-) hypertension(-) CAD and (-) Past MI (-) dysrhythmias  Rhythm:Regular Rate:Tachycardia - Systolic murmurs Tachycardia  ECG 08/28/22:  Sinus tachycardia with short PR  Otherwise normal ECG   Echo 05/21/22:  NORMAL LEFT VENTRICULAR SYSTOLIC FUNCTION  NORMAL RIGHT VENTRICULAR SYSTOLIC FUNCTION  TRIVIAL REGURGITATION NOTED   NO VALVULAR STENOSIS  TRIVIAL TR MR  EF 50%    Neuro/Psych  PSYCHIATRIC DISORDERS Anxiety     negative neurological ROS     GI/Hepatic ,neg GERD  ,,(+)     (-) substance abuse    Endo/Other  neg diabetesHypothyroidism    Renal/GU negative Renal ROS     Musculoskeletal   Abdominal   Peds  Hematology  (+) Blood dyscrasia, anemia On baby ASA, no stronger blood thinners. No bleeding disorders   Anesthesia Other Findings 42 yo G1P0 at 68 5/7 for epidural to c-section for failure to progress.   Cardiology note 09/23/22:  42 y.o. female with  1. Tachycardia  2. Palpitations  3. SOBOE (shortness of breath on exertion)  4. Anxiety  5. [redacted] weeks gestation of pregnancy   Plan  Third trimester pregnancy 36 weeks with baseline resting tachycardia patient has some lower extremity edema otherwise feels reasonably well Palpitations mild intermittent not related to activity Mild shortness of breath mostly with  exertion Lower extremity edema mild recommend support stockings elevation Anxiety mild continue current conservative management Have the patient follow-up as needed  Return if symptoms worsen or fail to improve.    Reproductive/Obstetrics (+) Pregnancy                              Anesthesia Physical Anesthesia Plan  ASA: 2  Anesthesia Plan: Spinal   Post-op Pain Management:    Induction:   PONV Risk Score and Plan: 2 and Treatment may vary due to age or medical condition and Ondansetron  Airway Management Planned: Natural Airway  Additional Equipment:   Intra-op Plan:   Post-operative Plan:   Informed Consent: I have reviewed the patients History and Physical, chart, labs and discussed the procedure including the risks, benefits and alternatives for the proposed anesthesia with the patient or authorized representative who has indicated his/her understanding and acceptance.     Dental Advisory Given  Plan Discussed with: Surgeon and CRNA  Anesthesia Plan Comments: (Patient reports no bleeding problems and no anticoagulant use.  Epidural now slightly one-sided; plan for spinal with backup GA.  Patient consented for risks of anesthesia including but not limited to:  - adverse reactions to medications - damage to eyes, teeth, lips or other oral mucosa - nerve damage due to positioning  - risk of bleeding, infection and or nerve damage from spinal that could lead to paralysis - risk of headache or failed spinal - damage to teeth, lips or other oral mucosa - sore throat or hoarseness -  damage to heart, brain, nerves, lungs, other parts of body or loss of life  Patient voiced understanding.)         Anesthesia Quick Evaluation

## 2022-10-02 NOTE — Progress Notes (Signed)
Labor Check  Subj:  Complaints: }has no unusual complaints  Obj:   Dose (milli-units/min) Oxytocin: 2 milli-units/min  Cervix: Dilation: 3 / Effacement (%): 60 / Station: -2  Baseline [id number]: 125 bpm, Variability: Good {> 6 bpm), Accelerations: Reactive, and Decelerations: Variable: moderatex1  Contractions: irregular, every 3-12 minutes Overall assessment: reassuring   Current Vital Signs 24h Vital Sign Ranges  T 98.8 F (37.1 C) Temp  Avg: 98.8 F (37.1 C)  Min: 98.8 F (37.1 C)  Max: 98.8 F (37.1 C)  BP 134/74 BP  Min: 125/78  Max: 157/82  HR (!) 105 Pulse  Avg: 103.6  Min: 93  Max: 114  RR 17 Resp  Avg: 18  Min: 17  Max: 19  SaO2 98 %   SpO2  Avg: 97.7 %  Min: 97 %  Max: 99 %       Medications SCHEDULED MEDICATIONS   ammonia       misoprostol       oxytocin       oxytocin 40 units in LR 1000 mL  333 mL Intravenous Once    MEDICATION INFUSIONS   fentaNYL 2 mcg/mL w/bupivacaine 0.125% in NS 250 mL 12 mL/hr (10/02/22 0154)   lactated ringers     lactated ringers     lactated ringers 125 mL/hr at 10/02/22 0009   oxytocin     oxytocin 2 milli-units/min (10/02/22 0333)    PRN MEDICATIONS  acetaminophen, ammonia, diphenhydrAMINE, ePHEDrine, ePHEDrine, fentaNYL 2 mcg/mL w/bupivacaine 0.125% in NS 250 mL, lactated ringers, lidocaine (PF), misoprostol, ondansetron, oxytocin, phenylephrine, phenylephrine, sodium citrate-citric acid, terbutaline    A/P: 42 y.o. G1P0 female at 81w5dwith Anemia  1.  Labor: Early latent labor. and Inadequate uterine activity - intensity or frequency. Labor Progressing normally and pain controlled  Epidural 2.  [id number]: Category II 3.  Group B Strep negative 4. Membranes ruptured, clear fluid 5.  Pain: none 6.  Recheck:Evaluated by digital exam. 7. Anticipate vaginal delivery., Analgesia: epidural, and Intervention: IV Pitocin augmentation, change maternal position, and anticipate vaginal delivery  [name] CCornerstone Hospital Of West Monroe3/14/2024 3:56 AM

## 2022-10-02 NOTE — Progress Notes (Addendum)
Labor Progress Note  Latasha Cannon is a 42 y.o. G1P0 at 29w5dby ultrasound admitted for PROM x 27hrs; ROM 3/13 at 0800.   Subjective: comfortable with epidural now.   Objective: BP 134/80   Pulse (!) 103   Temp 98.3 F (36.8 C) (Oral)   Resp 16   Ht '5\' 3"'$  (1.6 m)   Wt 81.2 kg   SpO2 98%   BMI 31.71 kg/m  Notable VS details: reviewed Vitals:   10/02/22 0208 10/02/22 0223 10/02/22 0238 10/02/22 0253  BP: 131/75 134/74 135/77 130/71   10/02/22 0308 10/02/22 0408 10/02/22 0508 10/02/22 0610  BP: 109/64 115/74 116/69 121/71   10/02/22 0710 10/02/22 0810 10/02/22 0910 10/02/22 1020  BP: 116/74 115/74 129/73 134/80     Fetal Assessment: FHT:  FHR: 145 bpm, variability: min-mod,  accelerations:  Abscent,  decelerations:  Present prlonged.  - Prolonged decel during SVE/pt positioned supine; IUPC and FSE placed; 455m decel to 65bpm with gradual return to baseline with maternal position changes, Pitocin DC, IV bolus and Terb given.  - ordered Amnioinfusion.  - discussed FHR tracing with Dr ScOuida Sills Category/reactivity:  Category II UC:   regular, every 3-4 minutes; pitocin at 1465min; DC during prolonged decel and remains off.  SVE:   4/90/-1; anterior, soft.   Membrane status: PROM 10/01/22 at 0800 Amniotic color: clear  Labs: Lab Results  Component Value Date   WBC 9.6 10/01/2022   HGB 10.4 (L) 10/01/2022   HCT 32.6 (L) 10/01/2022   MCV 83.8 10/01/2022   PLT 315 10/01/2022    Assessment / Plan: Augmentation of labor, progressing G1P0 at 37w48w5dOM x 27hrs  Labor: started Pitocin at 0333; titrating. Cervical change noted on exam, but now having Variable and prolonged decels. Preeclampsia:  no e/o Pre-E Fetal Wellbeing:  Category II Pain Control:  Epidural placed early due to pain control and inability to examine pt r/t hx vaginismus I/D:  GBS neg   RebeFrancetta FoundM 10/02/2022, 11:03 AM

## 2022-10-02 NOTE — Progress Notes (Signed)
Labor Progress Note  Latasha Cannon is a 42 y.o. G1P0 at 51w5dby ultrasound admitted for PROM x 27hrs; ROM 3/13 at 0800.   Subjective: comfortable with epidural   Objective:  Fetal Assessment: FHT:  FHR: 145 bpm, variability: min-mod,  accelerations:  Abscent,  decelerations:  Present occasional variable  Category/reactivity:  Category II UC:   regular, every 3-4 minutes; pitocin restarted and currently at 174mmin SVE:   5/90/-1; anterior, soft.   Membrane status: PROM 10/01/22 at 0800 Amniotic color: clear  Labs: Lab Results  Component Value Date   WBC 9.6 10/01/2022   HGB 10.4 (L) 10/01/2022   HCT 32.6 (L) 10/01/2022   MCV 83.8 10/01/2022   PLT 315 10/01/2022    Assessment / Plan: Augmentation of labor, progressing G1P0 at 3778w5dROM x 28hrs  Labor: started Pitocin at 0333; restarted at 1230; Cervical change since prolonged decels; Dr SchOuida Sillstified update.  Preeclampsia:  no e/o Pre-E Fetal Wellbeing:  Category II Pain Control:  Epidural placed early due to pain control and inability to examine pt r/t hx vaginismus I/D:  GBS neg   RebFrancetta FoundNM 10/02/2022, 4:27 PM

## 2022-10-02 NOTE — Anesthesia Procedure Notes (Signed)
Epidural Patient location during procedure: OB  Staffing Anesthesiologist: Arita Miss, MD Performed: anesthesiologist   Preanesthetic Checklist Completed: patient identified, IV checked, site marked, risks and benefits discussed, surgical consent, monitors and equipment checked, pre-op evaluation and timeout performed  Epidural Patient position: sitting Prep: ChloraPrep Patient monitoring: heart rate, continuous pulse ox and blood pressure Approach: midline Location: L3-L4 Injection technique: LOR saline  Needle:  Needle type: Tuohy  Needle gauge: 17 G Needle length: 9 cm Needle insertion depth: 6 cm Catheter type: closed end flexible Catheter size: 19 Gauge Catheter at skin depth: 11 cm Test dose: negative and 1.5% lidocaine with Epi 1:200 K  Assessment Sensory level: T10 Events: blood not aspirated, no cerebrospinal fluid, injection not painful, no injection resistance, no paresthesia and negative IV test  Additional Notes First/one attempt. Some visible scoliosis requiring puncture to be to the right of midline Pt. Evaluated and documentation done after procedure finished. Patient identified. Risks/Benefits/Options discussed with patient including but not limited to bleeding, infection, nerve damage, paralysis, failed block, incomplete pain control, headache, blood pressure changes, nausea, vomiting, reactions to medication both or allergic, itching and postpartum back pain. Confirmed with bedside nurse the patient's most recent platelet count. Confirmed with patient that they are not currently taking any anticoagulation, have any bleeding history or any family history of bleeding disorders. Patient expressed understanding and wished to proceed. All questions were answered. Sterile technique was used throughout the entire procedure. Please see nursing notes for vital signs. Test dose was given through epidural catheter and negative prior to continuing to dose epidural or start  infusion. Warning signs of high block given to the patient including shortness of breath, tingling/numbness in hands, complete motor block, or any concerning symptoms with instructions to call for help. Patient was given instructions on fall risk and not to get out of bed. All questions and concerns addressed with instructions to call with any issues or inadequate analgesia.     Patient tolerated the insertion well without immediate complications.  Reason for block: procedure for painReason for block:procedure for pain

## 2022-10-02 NOTE — Progress Notes (Signed)
Patient ID: Latasha Cannon, female   DOB: [birth year], 42 y.o.   MRN: [id number] Pt with 6 min decel . Position changes and ptocin off . FHR now back up with accels and good variability . Cx 8 cm and making change .  Continue close observation

## 2022-10-02 NOTE — Progress Notes (Signed)
Recheck cervix, now Anterior Lip/+1;    Cat I tracing Toco: q2-3  Last temp 100.2; Abx- Vanc infusing, Gent to follow.   Dr Ouida Sills updated.   Francetta Found, CNM

## 2022-10-03 ENCOUNTER — Other Ambulatory Visit: Payer: Self-pay

## 2022-10-03 ENCOUNTER — Encounter: Payer: Self-pay | Admitting: Obstetrics and Gynecology

## 2022-10-03 ENCOUNTER — Encounter: Admission: EM | Disposition: A | Discharge status: Home / Self Care | Payer: Self-pay | Source: Home / Self Care

## 2022-10-03 DIAGNOSIS — O411290 Chorioamnionitis, unspecified trimester, not applicable or unspecified: Secondary | ICD-10-CM | POA: Diagnosis not present

## 2022-10-03 LAB — CBC
HCT: 31.7 % — ABNORMAL LOW (ref 36.0–46.0)
Hemoglobin: 10.3 g/dL — ABNORMAL LOW (ref 12.0–15.0)
MCH: 27.4 pg (ref 26.0–34.0)
MCHC: 32.5 g/dL (ref 30.0–36.0)
MCV: 84.3 fL (ref 80.0–100.0)
Platelets: 269 10*3/uL (ref 150–400)
RBC: 3.76 MIL/uL — ABNORMAL LOW (ref 3.87–5.11)
RDW: 15.8 % — ABNORMAL HIGH (ref 11.5–15.5)
WBC: 25.6 10*3/uL — ABNORMAL HIGH (ref 4.0–10.5)
nRBC: 0 % (ref 0.0–0.2)

## 2022-10-03 LAB — CREATININE, SERUM
Creatinine, Ser: 0.83 mg/dL (ref 0.44–1.00)
GFR, Estimated: >60 mL/min (ref >60)

## 2022-10-03 SURGERY — Surgical Case
Anesthesia: General

## 2022-10-03 MED ORDER — ONDANSETRON HCL 4 MG/2ML IJ SOLN
INTRAMUSCULAR | Status: AC
  Filled 2022-10-03: qty 2

## 2022-10-03 MED ORDER — CLINDAMYCIN PHOSPHATE 900 MG/50ML IV SOLN
900.0000 mg | Freq: Three times a day (TID) | INTRAVENOUS | Status: DC
  Administered 2022-10-03 – 2022-10-04 (×4): 900 mg via INTRAVENOUS
  Filled 2022-10-03 (×4): qty 50

## 2022-10-03 MED ORDER — ACETAMINOPHEN 500 MG PO TABS
1000.0000 mg | ORAL_TABLET | Freq: Four times a day (QID) | ORAL | Status: DC
  Administered 2022-10-03 – 2022-10-05 (×9): 1000 mg via ORAL
  Filled 2022-10-03 (×7): qty 2

## 2022-10-03 MED ORDER — ROCURONIUM BROMIDE 100 MG/10ML IV SOLN
INTRAVENOUS | Status: DC | PRN
  Administered 2022-10-03: 20 mg via INTRAVENOUS

## 2022-10-03 MED ORDER — MORPHINE SULFATE (PF) 0.5 MG/ML IJ SOLN
INTRAMUSCULAR | Status: AC
  Filled 2022-10-03: qty 10

## 2022-10-03 MED ORDER — NALOXONE HCL 0.4 MG/ML IJ SOLN: 0.4000 mg | INTRAMUSCULAR | Status: DC | PRN

## 2022-10-03 MED ORDER — WITCH HAZEL-GLYCERIN EX PADS: 1.0000 | MEDICATED_PAD | CUTANEOUS | Status: DC | PRN

## 2022-10-03 MED ORDER — ZOLPIDEM TARTRATE 5 MG PO TABS: 5.0000 mg | ORAL_TABLET | Freq: Every evening | ORAL | Status: DC | PRN

## 2022-10-03 MED ORDER — BUPIVACAINE HCL (PF) 0.25 % IJ SOLN
INTRAMUSCULAR | Status: AC
  Filled 2022-10-03: qty 30

## 2022-10-03 MED ORDER — SCOPOLAMINE 1 MG/3DAYS TD PT72: 1.0000 | MEDICATED_PATCH | Freq: Once | TRANSDERMAL | Status: DC

## 2022-10-03 MED ORDER — ONDANSETRON HCL 4 MG/2ML IJ SOLN: 4.0000 mg | Freq: Three times a day (TID) | INTRAMUSCULAR | Status: DC | PRN

## 2022-10-03 MED ORDER — SODIUM CHLORIDE 0.9 % IV SOLN: 500.0000 mg | Freq: Once | INTRAVENOUS | Status: DC

## 2022-10-03 MED ORDER — SUCCINYLCHOLINE CHLORIDE 200 MG/10ML IV SOSY
PREFILLED_SYRINGE | INTRAVENOUS | Status: DC | PRN
  Administered 2022-10-03: 100 mg via INTRAVENOUS

## 2022-10-03 MED ORDER — METHYLERGONOVINE MALEATE 0.2 MG/ML IJ SOLN
INTRAMUSCULAR | Status: DC | PRN
  Administered 2022-10-03: .2 mg via INTRAMUSCULAR

## 2022-10-03 MED ORDER — FENTANYL CITRATE (PF) 100 MCG/2ML IJ SOLN
INTRAMUSCULAR | Status: AC
  Filled 2022-10-03: qty 2

## 2022-10-03 MED ORDER — DIPHENHYDRAMINE HCL 25 MG PO CAPS: 25.0000 mg | ORAL_CAPSULE | Freq: Four times a day (QID) | ORAL | Status: DC | PRN

## 2022-10-03 MED ORDER — SENNOSIDES-DOCUSATE SODIUM 8.6-50 MG PO TABS
2.0000 | ORAL_TABLET | Freq: Every day | ORAL | Status: DC
  Administered 2022-10-04 – 2022-10-05 (×2): 2 via ORAL
  Filled 2022-10-03 (×2): qty 2

## 2022-10-03 MED ORDER — ACETAMINOPHEN 500 MG PO TABS
ORAL_TABLET | ORAL | Status: AC
  Administered 2022-10-03: 1000 mg
  Filled 2022-10-03: qty 2

## 2022-10-03 MED ORDER — METHYLERGONOVINE MALEATE 0.2 MG/ML IJ SOLN
INTRAMUSCULAR | Status: AC
  Filled 2022-10-03: qty 1

## 2022-10-03 MED ORDER — NALOXONE HCL 4 MG/10ML IJ SOLN: 1.0000 ug/kg/h | INTRAVENOUS | Status: DC | PRN

## 2022-10-03 MED ORDER — GABAPENTIN 300 MG PO CAPS: 300.0000 mg | ORAL_CAPSULE | ORAL | Status: AC

## 2022-10-03 MED ORDER — VANCOMYCIN HCL IN DEXTROSE 1-5 GM/200ML-% IV SOLN
1000.0000 mg | Freq: Once | INTRAVENOUS | Status: AC
  Administered 2022-10-03: 1000 mg via INTRAVENOUS
  Filled 2022-10-03: qty 200

## 2022-10-03 MED ORDER — OXYCODONE HCL 5 MG PO TABS: 5.0000 mg | ORAL_TABLET | ORAL | Status: DC | PRN

## 2022-10-03 MED ORDER — GENTAMICIN SULFATE 40 MG/ML IJ SOLN
5.0000 mg/kg | INTRAVENOUS | Status: DC
  Filled 2022-10-03: qty 8

## 2022-10-03 MED ORDER — IBUPROFEN 600 MG PO TABS
600.0000 mg | ORAL_TABLET | Freq: Four times a day (QID) | ORAL | Status: DC
  Administered 2022-10-04 – 2022-10-05 (×7): 600 mg via ORAL
  Filled 2022-10-03 (×7): qty 1

## 2022-10-03 MED ORDER — PRENATAL MULTIVITAMIN CH
1.0000 | ORAL_TABLET | Freq: Every day | ORAL | Status: DC
  Administered 2022-10-03 – 2022-10-05 (×3): 1 via ORAL
  Filled 2022-10-03 (×3): qty 1

## 2022-10-03 MED ORDER — MORPHINE SULFATE (PF) 2 MG/ML IV SOLN: 1.0000 mg | INTRAVENOUS | Status: DC | PRN

## 2022-10-03 MED ORDER — MORPHINE SULFATE (PF) 0.5 MG/ML IJ SOLN
INTRAMUSCULAR | Status: DC | PRN
  Administered 2022-10-03: .1 mg via INTRATHECAL

## 2022-10-03 MED ORDER — ONDANSETRON HCL 4 MG/2ML IJ SOLN
INTRAMUSCULAR | Status: DC | PRN
  Administered 2022-10-03: 4 mg via INTRAVENOUS

## 2022-10-03 MED ORDER — KETOROLAC TROMETHAMINE 30 MG/ML IJ SOLN
30.0000 mg | Freq: Four times a day (QID) | INTRAMUSCULAR | Status: AC
  Administered 2022-10-03 (×4): 30 mg via INTRAVENOUS
  Filled 2022-10-03 (×4): qty 1

## 2022-10-03 MED ORDER — DIPHENHYDRAMINE HCL 50 MG/ML IJ SOLN: 12.5000 mg | INTRAMUSCULAR | Status: DC | PRN

## 2022-10-03 MED ORDER — DIBUCAINE (PERIANAL) 1 % EX OINT: 1.0000 | TOPICAL_OINTMENT | CUTANEOUS | Status: DC | PRN

## 2022-10-03 MED ORDER — DEXAMETHASONE SODIUM PHOSPHATE 10 MG/ML IJ SOLN
INTRAMUSCULAR | Status: AC
  Filled 2022-10-03: qty 1

## 2022-10-03 MED ORDER — MEPERIDINE HCL 25 MG/ML IJ SOLN: 6.2500 mg | INTRAMUSCULAR | Status: DC | PRN

## 2022-10-03 MED ORDER — PROPOFOL 10 MG/ML IV BOLUS
INTRAVENOUS | Status: DC | PRN
  Administered 2022-10-03: 50 mg via INTRAVENOUS
  Administered 2022-10-03: 150 mg via INTRAVENOUS

## 2022-10-03 MED ORDER — MENTHOL 3 MG MT LOZG: 1.0000 | LOZENGE | OROMUCOSAL | Status: DC | PRN

## 2022-10-03 MED ORDER — BUPIVACAINE HCL (PF) 0.25 % IJ SOLN
INTRAMUSCULAR | Status: DC | PRN
  Administered 2022-10-03: 60 mL

## 2022-10-03 MED ORDER — SODIUM CHLORIDE 0.9% FLUSH: 3.0000 mL | INTRAVENOUS | Status: DC | PRN

## 2022-10-03 MED ORDER — DEXAMETHASONE SODIUM PHOSPHATE 10 MG/ML IJ SOLN
INTRAMUSCULAR | Status: DC | PRN
  Administered 2022-10-03: 10 mg via INTRAVENOUS

## 2022-10-03 MED ORDER — KETOROLAC TROMETHAMINE 30 MG/ML IJ SOLN: 30.0000 mg | Freq: Four times a day (QID) | INTRAMUSCULAR | Status: AC | PRN

## 2022-10-03 MED ORDER — DIPHENHYDRAMINE HCL 25 MG PO CAPS: 25.0000 mg | ORAL_CAPSULE | ORAL | Status: DC | PRN

## 2022-10-03 MED ORDER — PHENYLEPHRINE HCL-NACL 20-0.9 MG/250ML-% IV SOLN
INTRAVENOUS | Status: DC | PRN
  Administered 2022-10-03: 50 ug/min via INTRAVENOUS

## 2022-10-03 MED ORDER — ENOXAPARIN SODIUM 40 MG/0.4ML IJ SOSY
40.0000 mg | PREFILLED_SYRINGE | INTRAMUSCULAR | Status: DC
  Administered 2022-10-04 – 2022-10-05 (×2): 40 mg via SUBCUTANEOUS
  Filled 2022-10-03 (×2): qty 0.4

## 2022-10-03 MED ORDER — FENTANYL CITRATE (PF) 100 MCG/2ML IJ SOLN
INTRAMUSCULAR | Status: DC | PRN
  Administered 2022-10-03: 85 ug via INTRAVENOUS

## 2022-10-03 MED ORDER — SUGAMMADEX SODIUM 200 MG/2ML IV SOLN
INTRAVENOUS | Status: DC | PRN
  Administered 2022-10-03: 180 mg via INTRAVENOUS

## 2022-10-03 MED ORDER — SIMETHICONE 80 MG PO CHEW: 80.0000 mg | CHEWABLE_TABLET | ORAL | Status: DC | PRN

## 2022-10-03 MED ORDER — ACETAMINOPHEN 500 MG PO TABS
1000.0000 mg | ORAL_TABLET | Freq: Four times a day (QID) | ORAL | Status: AC
  Administered 2022-10-03: 1000 mg via ORAL
  Filled 2022-10-03 (×3): qty 2

## 2022-10-03 MED ORDER — FENTANYL CITRATE (PF) 100 MCG/2ML IJ SOLN
INTRAMUSCULAR | Status: DC | PRN
  Administered 2022-10-03: 15 ug via INTRATHECAL

## 2022-10-03 MED ORDER — SODIUM CHLORIDE 0.9% FLUSH
INTRAVENOUS | Status: DC | PRN
  Administered 2022-10-03: 20 mL

## 2022-10-03 MED ORDER — GABAPENTIN 300 MG PO CAPS
ORAL_CAPSULE | ORAL | Status: AC
  Administered 2022-10-03: 300 mg
  Filled 2022-10-03: qty 1

## 2022-10-03 MED ORDER — CARBOPROST TROMETHAMINE 250 MCG/ML IM SOLN
INTRAMUSCULAR | Status: AC
  Filled 2022-10-03: qty 1

## 2022-10-03 MED ORDER — VARICELLA VIRUS VACCINE LIVE 1350 PFU/0.5ML IJ SUSR
0.5000 mL | INTRAMUSCULAR | Status: AC | PRN
  Administered 2022-10-05: 0.5 mL via SUBCUTANEOUS
  Filled 2022-10-03: qty 0.5

## 2022-10-03 MED ORDER — SOD CITRATE-CITRIC ACID 500-334 MG/5ML PO SOLN: 30.0000 mL | Freq: Once | ORAL | Status: DC

## 2022-10-03 MED ORDER — COCONUT OIL OIL: 1.0000 | TOPICAL_OIL | Status: DC | PRN

## 2022-10-03 MED ORDER — GABAPENTIN 300 MG PO CAPS
300.0000 mg | ORAL_CAPSULE | Freq: Every day | ORAL | Status: DC
  Administered 2022-10-03 – 2022-10-04 (×2): 300 mg via ORAL
  Filled 2022-10-03 (×2): qty 1

## 2022-10-03 MED ORDER — BUPIVACAINE IN DEXTROSE 0.75-8.25 % IT SOLN
INTRATHECAL | Status: DC | PRN
  Administered 2022-10-03: 1.4 mL via INTRATHECAL

## 2022-10-03 MED ORDER — VANCOMYCIN HCL IN DEXTROSE 1-5 GM/200ML-% IV SOLN
1000.0000 mg | Freq: Two times a day (BID) | INTRAVENOUS | Status: DC
  Filled 2022-10-03: qty 200

## 2022-10-03 MED ORDER — OXYTOCIN-SODIUM CHLORIDE 30-0.9 UT/500ML-% IV SOLN: 2.5000 [IU]/h | INTRAVENOUS | Status: AC

## 2022-10-03 MED ORDER — SIMETHICONE 80 MG PO CHEW
80.0000 mg | CHEWABLE_TABLET | Freq: Three times a day (TID) | ORAL | Status: DC
  Administered 2022-10-03 – 2022-10-05 (×7): 80 mg via ORAL
  Filled 2022-10-03 (×8): qty 1

## 2022-10-03 SURGICAL SUPPLY — 30 items
BARRIER ADHS 3X4 INTERCEED (GAUZE/BANDAGES/DRESSINGS) ×1 IMPLANT
CHLORAPREP W/TINT 26 (MISCELLANEOUS) ×1 IMPLANT
DRSG TELFA 3X8 NADH STRL (GAUZE/BANDAGES/DRESSINGS) ×1 IMPLANT
DRSG TELFA 4X10 ISLAND STR (GAUZE/BANDAGES/DRESSINGS) IMPLANT
ELECT CAUTERY BLADE 6.4 (BLADE) ×1 IMPLANT
ELECT REM PT RETURN 9FT ADLT (ELECTROSURGICAL) ×1 IMPLANT
ELECTRODE REM PT RTRN 9FT ADLT (ELECTROSURGICAL) ×1 IMPLANT
GAUZE SPONGE 4X4 12PLY STRL (GAUZE/BANDAGES/DRESSINGS) ×1 IMPLANT
GLOVE SURG SYN 8.0 (GLOVE) ×1 IMPLANT
GLOVE SURG SYN 8.0 PF PI (GLOVE) ×1 IMPLANT
GOWN STRL REUS W/ TWL LRG LVL3 (GOWN DISPOSABLE) ×2 IMPLANT
GOWN STRL REUS W/ TWL XL LVL3 (GOWN DISPOSABLE) ×1 IMPLANT
GOWN STRL REUS W/TWL LRG LVL3 (GOWN DISPOSABLE) ×2
GOWN STRL REUS W/TWL XL LVL3 (GOWN DISPOSABLE) ×1
MANIFOLD NEPTUNE II (INSTRUMENTS) ×1 IMPLANT
MAT PREVALON FULL STRYKER (MISCELLANEOUS) ×1 IMPLANT
NEEDLE HYPO 22GX1.5 SAFETY (NEEDLE) ×1 IMPLANT
NS IRRIG 1000ML POUR BTL (IV SOLUTION) ×1 IMPLANT
PACK C SECTION AR (MISCELLANEOUS) ×1 IMPLANT
PAD OB MATERNITY 4.3X12.25 (PERSONAL CARE ITEMS) ×1 IMPLANT
PAD PREP 24X41 OB/GYN DISP (PERSONAL CARE ITEMS) ×1 IMPLANT
SCRUB CHG 4% DYNA-HEX 4OZ (MISCELLANEOUS) ×1 IMPLANT
STAPLER INSORB 30 2030 C-SECTI (MISCELLANEOUS) IMPLANT
STRAP SAFETY 5IN WIDE (MISCELLANEOUS) ×1 IMPLANT
SUT CHROMIC 1 CTX 36 (SUTURE) ×3 IMPLANT
SUT PLAIN GUT 0 (SUTURE) ×2 IMPLANT
SUT VIC AB 0 CT1 36 (SUTURE) ×2 IMPLANT
SYR 30ML LL (SYRINGE) ×2 IMPLANT
TRAP FLUID SMOKE EVACUATOR (MISCELLANEOUS) ×1 IMPLANT
WATER STERILE IRR 500ML POUR (IV SOLUTION) ×1 IMPLANT

## 2022-10-03 NOTE — Brief Op Note (Signed)
10/01/2022 - 10/03/2022  4:48 AM  PATIENT:  Marliss Czar A Maston  42 y.o. female  PRE-OPERATIVE DIAGNOSIS:  APA, chorioamnionitis  POST-OPERATIVE DIAGNOSIS:  Same , female delivered  PROCEDURE:  Procedure(s): CESAREAN SECTION, Primary LTCS   SURGEON:  Surgeon(s) and Role:    * Watt Geiler, Gwen Her, MD - Primary  PHYSICIAN ASSISTANT: Hassan Buckler , CNM  ASSISTANTS: none   ANESTHESIA: spinal --->  GETA QBL 565 cc BLOOD ADMINISTERED:none  DRAINS: Urinary Catheter (Foley)   LOCAL MEDICATIONS USED:  MARCAINE     SPECIMEN:  No Specimen  DISPOSITION OF SPECIMEN:  N/A  COUNTS:  YES  TOURNIQUET:  * No tourniquets in log *  DICTATION: .Other Dictation: Dictation Number verbal  PLAN OF CARE: Admit to inpatient   PATIENT DISPOSITION:  PACU - hemodynamically stable.   Delay start of Pharmacological VTE agent (>24hrs) due to surgical blood loss or risk of bleeding: not applicable

## 2022-10-03 NOTE — Progress Notes (Signed)
Postop Day  0  Subjective: Feeling tired, resting in bed.  Doing well, no concerns. Foley remains in situ, draining large amount of clear, yellow urine.   No fever/chills, chest pain, shortness of breath, nausea/vomiting, or leg pain. No nipple or breast pain. No headache, visual changes, or RUQ/epigastric pain.  Objective: BP 117/81 (BP Location: Right Arm)   Pulse 92   Temp 97.8 F (36.6 C) (Oral)   Resp 18   Ht 5\' 3"  (1.6 m)   Wt 81.2 kg   SpO2 100%   Breastfeeding Unknown   BMI 31.71 kg/m    Physical Exam:  General: alert, cooperative, fatigued, no distress, and pale Breasts: soft/nontender CV: RRR Pulm: nl effort, CTABL Abdomen: soft, non-tender, active bowel sounds Uterine Fundus: firm Incision: no significant drainage, covered with pressure dressing  Perineum: minimal edema, intact Lochia: appropriate DVT Evaluation: No evidence of DVT seen on physical exam.  Recent Labs    10/01/22 2359 10/03/22 1157  HGB 10.4* 10.3*  HCT 32.6* 31.7*  WBC 9.6 25.6*  PLT 315 269    Assessment/Plan: 42 y.o. G1P1001 postpartum day # 0  -Continue routine postpartum care -Lactation consult PRN for breastfeeding  -Discussed contraceptive options including implant, IUDs hormonal and non-hormonal, injection, pills/ring/patch, condoms, and NFP.  -Acute leukocytosis secondary to chorioamnionitis in labor.  Received Vancomycin, Gent, and Clindamycin in labor.  Vanc given x 1 postpartum and will continue gent and clindamycin for 24 hours postpartum  -Repeat CBC in AM  -TMax 38.1 at 2121 on 10/02/2022 -Immunization status:   needs Varicella prior to discharge    Disposition: Continue inpatient postpartum care    LOS: 2 days   Minda Meo, CNM 10/03/2022, 12:22 PM   ----- Drinda Butts  Certified Nurse Midwife [facility]

## 2022-10-03 NOTE — Anesthesia Procedure Notes (Signed)
Spinal  Patient location during procedure: OR Start time: 10/03/2022 4:00 AM Reason for block: surgical anesthesia Staffing Performed: resident/CRNA  Anesthesiologist: Darrin Nipper, MD Resident/CRNA: Rolla Plate, CRNA Performed by: Rolla Plate, CRNA Authorized by: Darrin Nipper, MD   Preanesthetic Checklist Completed: patient identified, IV checked, site marked, risks and benefits discussed, surgical consent, monitors and equipment checked, pre-op evaluation and timeout performed Spinal Block Patient position: sitting Prep: ChloraPrep and site prepped and draped Patient monitoring: heart rate, continuous pulse ox, blood pressure and cardiac monitor Approach: midline Location: L4-5 Injection technique: single-shot Needle Needle type: Whitacre and Introducer  Needle gauge: 24 G Needle length: 9 cm Assessment Sensory level: T4 Events: CSF return Additional Notes Negative paresthesia. Negative blood return. Positive free-flowing CSF. Expiration date of kit checked and confirmed. Patient tolerated procedure well, without complications.

## 2022-10-03 NOTE — Lactation Note (Signed)
This note was copied from a baby's chart. Lactation Consultation Note  Patient Name: Latasha Cannon [id number] Date: 10/03/2022 Age:43 hours Reason for consult: Follow-up assessment;Primapara;Early term 37-38.6wks;Breastfeeding assistance;RN request;Mother's request   Maternal Data This is mom's 1st baby, C/S for arrest of dilation. Mom with a history of anemia, AMA, elevated BP in 3rd trimester, anxiety, thyroidectomy for stage 3 thyroid cancer, hypothyroidism in pregnancy(on synthroid).  Initial visit occurred in L&D as transitional care nurse called LC to assist after she attempted to assist mom/baby with breastfeeding and baby did not latch. RN had also attempted with nipple shield however baby did not latch and feed. LC assisted mom with first feeding after several attempts baby did latch with a few audible swallows for 5 minutes. Baby did not continue despite techniques to maximize latch including a 2nd attempt with the nipple shield in which the baby became gaggy. Baby placed skin to skin with mom and transitional care nurse updated.  At 2nd breastfeeding attempt [place] assisted mom and baby with breastfeeding. Baby latched very briefly and would not continue. Discussed with mom and dad initiation of early term feeding plan. After discussing plan with parents NP Dionne Milo in to examine baby and was updated regarding feeding thus far. NP ordered donor milk for supplementation to support early term feeding plan if baby does not feed at the breast. Mom prefers donor milk for now.  At the 3rd feeding attempt baby would not latch. Assisted mom with DEBP. Provided 1 drop of mom's colostrum via syringe. Care nurse to assist mom with bottle feeding baby donor breastmilk.. Has patient been taught Hand Expression?: Yes Does the patient have breastfeeding experience prior to this delivery?: No  Feeding Mother's Current Feeding Choice: Breast Milk and Donor Milk  LATCH Score Latch: Too sleepy or reluctant, no  latch achieved, no sucking elicited.  Audible Swallowing: None  Type of Nipple: Everted at rest and after stimulation  Comfort (Breast/Nipple): Soft / non-tender  Hold (Positioning): Assistance needed to correctly position infant at breast and maintain latch.  LATCH Score: 5   Lactation Tools Discussed/Used  DEBP: instructed on use, cleaning of parts, and milk storage guidelines.  Interventions Interventions: Breast feeding basics reviewed;Assisted with latch;Breast massage;Hand express;DEBP;Breast compression;Pre-pump if needed;Adjust position;Support pillows (Reviewed early term feeding plan.) Provided mom with written plan. Mom verbalized understanding and is in agreement with plan.  Discharge Pump: Personal  Consult Status Consult Status: Follow-up Date: 10/04/22 Follow-up type: In-patient  Update provided to care nurse.  Jonna Mackey Varricchio 10/03/2022, 3:26 PM

## 2022-10-03 NOTE — Transfer of Care (Signed)
Immediate Anesthesia Transfer of Care Note  Patient: Latasha Cannon  Procedure(s) Performed: CESAREAN SECTION  Patient Location: PACU  Anesthesia Type:General  Level of Consciousness: drowsy  Airway & Oxygen Therapy: Patient Spontanous Breathing and Patient connected to face mask oxygen  Post-op Assessment: Report given to RN and Post -op Vital signs reviewed and stable  Post vital signs: Reviewed  Last Vitals:  Vitals Value Taken Time  BP 97/73 10/03/22 0510  Temp 36.2 C 10/03/22 0510  Pulse 92 10/03/22 0510  Resp 16 10/03/22 0510  SpO2 92 % 10/03/22 0510    Last Pain:  Vitals:   10/03/22 0223  TempSrc: Oral  PainSc:       Patients Stated Pain Goal: 0 ([id number])  Complications: No notable events documented.

## 2022-10-03 NOTE — Op Note (Unsigned)
Latasha Cannon, [place] MEDICAL RECORD NO: [id number] ACCOUNT NO: [id number] DATE OF BIRTH: [birth year] FACILITY: [facility] LOCATION: [facility] PHYSICIAN: Boykin Nearing, MD  Operative Report   DATE OF PROCEDURE: 10/03/2022  PREOPERATIVE DIAGNOSES:   1.  37+6 weeks estimated gestational age. 2.  Chorioamnionitis. 3.  Active phase arrest.  POSTOPERATIVE DIAGNOSES:  1.  37+6 weeks estimated gestational age. 2.  Chorioamnionitis. 3.  Active phase arrest. 4. Female delivered.  SURGEON:  Boykin Nearing, MD  FIRST ASSISTANT:  Hassan Buckler, certified nurse midwife.  ANESTHESIA:  Spinal, converted to general endotracheal anesthesia.  PROCEDURE:  Primary low transverse cesarean section.  INDICATIONS:  A 42 year old gravida 1, para 0 patient admitted in labor 44 hours prior to the procedure.  The patient labored throughout the better part of 2 days and developed a fever and was treated for chorioamnionitis.  The patient progressed to a 9  cm and had intermittent prolonged decelerations.  Therefore, it was deemed in her and the baby's best interest to proceed with a primary low transverse cesarean section.  DESCRIPTION OF PROCEDURE:  After spinal anesthesia was placed and the patient was prepped and draped in normal sterile fashion, timeout was performed.  The patient was not adequately anesthetized.  Therefore, spinal was converted to a general  endotracheal anesthesia.  Pfannenstiel incision was made 2 fingerbreadths above the symphysis pubis.  Sharp dissection was used to identify the fascia.  Fascia was opened in the midline and opened in a transverse fashion.  Superior aspect of fascia was  grasped with Kocher clamps and the recti muscles were dissected free.  Inferior aspect of fascia was grasped with Kocher clamps.  Pyramidalis muscle was dissected.  Peritoneal cavity was opened sharply and vesicouterine peritoneal fold was identified and  bladder flap was created and the  bladder was reflected inferiorly.  Low transverse uterine incision was made.  Upon entry into the endometrial cavity, clear fluid resulted.  The incision was extended with blunt transverse traction.  Fetal head was  wedged but was able to be elevated through the incision and with fundal pressure, the head and body were delivered and cord was doubly clamped and somewhat atonic infant was passed to nursery staff who assigned Apgars scores of 2 and 8.  Cord gas pending  at the time of dictation.  Placenta was manually delivered and the uterus was exteriorized.  Uterine incision was closed with 1 chromic suture in a running locking fashion.  Several additional figure-of-eight sutures were required with a small extension  on the left lower angle.  Posterior cul-de-sac was irrigated and suctioned and the uterus was placed back into the abdominal cavity and the pericolic gutters were wiped clean with laparotomy tape.  Uterine incision again appeared hemostatic and  Interceed was placed over the uterine incision in a T-shape fashion.  Fascia was then closed with 0 Vicryl suture in a running nonlocking fashion. Good approximation of edges.  The fascial edges were injected with a solution of 0.25% Marcaine 60 mL plus  20 mL normal saline.  Approximately 40 mL of the solution was injected in the fascial edges.  Subcutaneous tissues were irrigated and bovied for hemostasis and the skin was reapproximated with Insorb absorbable staples.  Good cosmetic effect.  Additional  20 mL of Marcaine solution injected.  The patient did receive 900 mg intravenous clindamycin prior to commencement of the case for surgical prophylaxis given her multiple allergies. She had been previously dosed with gentamicin treating chorioamnionitis  and  vancomycin.  The patient tolerated the procedure well.  INTRAOPERATIVE FLUIDS:  500 mL  URINE OUTPUT:  200 mL, clear.  QUANTITATIVE BLOOD LOSS:  565 mL      PAA D: 10/03/2022 5:11:54 am  T: 10/03/2022 5:34:00 am  JOB: [id number]

## 2022-10-03 NOTE — Progress Notes (Signed)
Labor Progress Note  Latasha Cannon is a 42 y.o. G1P0 at [redacted]w[redacted]d by LMP admitted for PROM  Subjective: feeling some pressure and left side contractions   Objective: BP 125/71 (BP Location: Right Arm)   Pulse (!) 113   Temp 98.6 F (37 C) (Oral)   Resp 18   Ht 5\' 3"  (1.6 m)   Wt 81.2 kg   SpO2 99%   BMI 31.71 kg/m    Fetal Assessment:  FHT:  FHR: 130 bpm, variability: moderate,  accelerations:  Present,  decelerations:  Present prolonged , FHR to 100bpm x 52min with last UC   Category/reactivity:  Category II UC:   q3-70min, Pit off  SVE:   Exam by CNM- anterior cervical lip noted with push creating tight band.  Station remains +1, tight anterior lip.   Previous exam: 8cm at 8pm, ant lip at 2300  Membrane status: 3/13 at 0800 Amniotic color: clear  Labs: Lab Results  Component Value Date   WBC 9.6 10/01/2022   HGB 10.4 (L) 10/01/2022   HCT 32.6 (L) 10/01/2022   MCV 83.8 10/01/2022   PLT 315 10/01/2022    Assessment / Plan: G1P0 at [redacted]w[redacted]d  PROM   Dr schermerhorn called, notified of persistent lip, will proceed to [place], [place] 10/03/2022, 3:03 AM

## 2022-10-03 NOTE — Anesthesia Procedure Notes (Signed)
Procedure Name: Intubation Date/Time: 10/03/2022 4:12 AM  Performed by: Rolla Plate, CRNAPre-anesthesia Checklist: Patient identified, Patient being monitored, Timeout performed, Emergency Drugs available and Suction available Patient Re-evaluated:Patient Re-evaluated prior to induction Oxygen Delivery Method: Circle system utilized Preoxygenation: Pre-oxygenation with 100% oxygen Induction Type: IV induction and Rapid sequence Laryngoscope Size: 3 and McGraph Grade View: Grade I Tube type: Oral Tube size: 7.0 mm Number of attempts: 1 Airway Equipment and Method: Stylet and Video-laryngoscopy Placement Confirmation: ETT inserted through vocal cords under direct vision, positive ETCO2 and breath sounds checked- equal and bilateral Secured at: 21 cm Tube secured with: Tape Dental Injury: Teeth and Oropharynx as per pre-operative assessment

## 2022-10-03 NOTE — Progress Notes (Signed)
Srom now 44 hours . Pt still have small rim . Chorioamnionitis s/p Gent and Vancomycin . Intermittent prolonged decelerations . Currently fetal tracing is reassuring . Given the cervix has not completely dilated and infection and prolong ROM I recommend proceeding to Primary LTCS  .couple understands and agrees .  The risks of cesarean section discussed with the patient included but were not limited to: bleeding which may require transfusion or reoperation; infection which may require antibiotics; injury to bowel, bladder, ureters or other surrounding organs; injury to the fetus; need for additional procedures including hysterectomy in the event of a life-threatening hemorrhage; placental abnormalities wth subsequent pregnancies, incisional problems, thromboembolic phenomenon and other postoperative/anesthesia complications. The patient concurred with the proposed plan, giving informed written consent for the procedure.    Preoperative prophylactic antibiotics and SCDs ordered on call to the OR.  To OR when ready.   Given Biaxin allergy  I will use clindamycin for surgical prophylaxis and continue with q 24 gentamicin and clindamycin 900 mg q 8 hr.

## 2022-10-03 NOTE — Progress Notes (Signed)
Labor Progress Note  Latasha Cannon is a 42 y.o. G1P0 at [redacted]w[redacted]d by LMP admitted for PROM  Subjective: feeling some pressure   Per Nursing C/C/+1 at 0110; trial of pushing at 0125 with prolonged decel at 0135 and CNM called to bedside at 0139  Objective: BP (!) 116/55   Pulse (!) 129   Temp 99.5 F (37.5 C) (Axillary)   Resp 18   Ht 5\' 3"  (1.6 m)   Wt 81.2 kg   SpO2 96%   BMI 31.71 kg/m    Fetal Assessment:  FHT:  FHR: 130 bpm, variability: moderate,  accelerations:  Present,  decelerations:  Present prolonged - 27min decel to nadir 65bpm, resolved with position change, IV bolus and Pitocin DC.   Category/reactivity:  Category II UC:   q3-64min, Pit at 32mu/min (DC with decel).  SVE:   Exam by CNM- anterior cervical lip noted with push creating tight band.   Previous exam: 8cm at 8pm, ant lip at 2300  Membrane status: 3/13 at 0800 Amniotic color: clear  Labs: Lab Results  Component Value Date   WBC 9.6 10/01/2022   HGB 10.4 (L) 10/01/2022   HCT 32.6 (L) 10/01/2022   MCV 83.8 10/01/2022   PLT 315 10/01/2022    Assessment / Plan: G1P0 at [redacted]w[redacted]d  PROM   Dr schermerhorn called, notified of decel, cervical exam.  - will recheck in 1 hour and notify MD at that time.    Latasha Cannon, CNM 10/03/2022, 1:57 AM

## 2022-10-03 NOTE — Progress Notes (Signed)
Pt provided with incentive spirometer and educated on proper use. SpO2 stats ranging from 80s-90s with simple face mask at 5L/min. Anesthesia in department and notified.

## 2022-10-03 NOTE — Discharge Summary (Shared)
Obstetrical Discharge Summary  Patient Name: Latasha Cannon DOB: July 30, 1980 MRN: [id number]  Date of Admission: 10/01/2022 Date of Delivery: 10/03/22 Delivered by: Schermerhorn MD Date of Discharge: 10/05/2022  Primary OB: [facility] LMP:No LMP recorded. EDC Estimated Date of Delivery: 10/18/22 Gestational Age at Delivery: [redacted]w[redacted]d   Antepartum complications:  AMA  Anemia  Postsurgical hypothyroidism  Varicella non-immune Tachycardia Vaginismus  Anxiety   Admitting Diagnosis: Leakage of amniotic fluid [O42.90]  Secondary Diagnosis: Patient Active Problem List   Diagnosis Date Noted   Cesarean delivery delivered 10/03/2022   Chorioamnionitis 10/03/2022   Prolonged rupture of membranes 10/01/2022   Elevated blood pressure affecting pregnancy in third trimester, antepartum 09/30/2022   Non-reactive NST (non-stress test) 09/29/2022   Anemia affecting pregnancy in third trimester 08/01/2022   Hypothyroid in pregnancy, antepartum 07/03/2022   Susceptible to varicella (non-immune), currently pregnant 04/10/2022   Supervision of high risk elderly primigravida in third trimester 03/13/2022   Post-surgical hypothyroidism 03/18/2021   Avitaminosis D 02/24/2018   Family history of breast cancer 02/24/2018   Palpitations 01/11/2016   Tachycardia 01/11/2016   Menorrhagia 01/11/2016   Papillary thyroid carcinoma ([place]) 02/22/2015   Anxiety 02/22/2015   Dyspareunia, female 02/22/2015   Allergic rhinitis 02/21/2015   Adaptation reaction 02/21/2015   Adenitis, salivary, 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 Dx with chorio with fever, fetal tachycardia  and prolonged ROM; given Vanc and Gent. Reached anterior lip without progression or further descent. See Op note. Delivery Type: primary cesarean section, low transverse incision Anesthesia: epidural anesthesia Placenta:  manual removal To Pathology: No  Laceration: none Episiotomy: none QBL: 573ml   Newborn Data: 10/03/22 at 36 LIveborn newborn female "Sidney" Birthweight: 6#0 Apgars 2/8   Postpartum Procedures:  venofer, SW consult for EPDS  Edinburgh:     10/04/2022   11:46 AM  Edinburgh Postnatal Depression Scale Screening Tool  I have been able to laugh and see the funny side of things. 2  I have looked forward with enjoyment to things. 1  I have blamed myself unnecessarily when things went wrong. 3  I have been anxious or worried for no good reason. 0  I have felt scared or panicky for no good reason. 1  Things have been getting on top of me. 3  I have been so unhappy that I have had difficulty sleeping. 1  I have felt sad or miserable. 3  I have been so unhappy that I have been crying. 2  The thought of harming myself has occurred to me. 0  Edinburgh Postnatal Depression Scale Total 16     Post partum course- Cesarean Section:  Patient had an uncomplicated postpartum course.  By time of discharge on POD#2, her pain was controlled on oral pain medications; she had appropriate lochia and was ambulating, voiding without difficulty, tolerating regular diet and passing flatus.   She was deemed stable for discharge to home.    Discharge Physical Exam:  BP 126/72 (BP Location: Left Arm)   Pulse (!) 104   Temp 99.2 F (37.3 C) (Oral)   Resp 20   Ht 5\' 3"  (1.6 m)   Wt 80.1 kg   SpO2 96%   Breastfeeding Unknown   BMI 31.28 kg/m  General: NAD CV: RRR Pulm: CTABL, nl effort ABD: s/nd/nt, fundus firm and below the umbilicus Lochia: moderate Perineum:minimal edema/intact Incision: c/d/I, covered with occlusive OP site dressing  DVT Evaluation: LE non-ttp, no  evidence of DVT on exam.  Hemoglobin  Date Value Ref Range Status  10/04/2022 8.4 (L) 12.0 - 15.0 g/dL Final  03/11/2021 13.4 11.1 - 15.9 g/dL Final   HCT  Date Value Ref Range Status  10/04/2022 26.5 (L) 36.0 - 46.0 % Final   Hematocrit  Date Value Ref Range Status  03/11/2021 40.3 34.0 - 46.6 % Final    Risk assessment for postpartum VTE and prophylactic treatment: Very high risk factors: None High risk factors: Unscheduled cesarean after labor  Moderate risk factors: BMI 30-40 kg/m2  Postpartum VTE prophylaxis with LMWH not indicated  Disposition: stable, discharge to home. Baby Feeding: breast feeding Baby Disposition: home with mom  Rh Immune globulin indicated: No Rubella vaccine given: was not indicated Varivax vaccine given: was given Flu vaccine given in AP setting: Yes  Tdap vaccine given in AP setting: Yes   Contraception:  TBD  Prenatal Labs:  : Blood type/Rh B pos  Antibody screen Negative    Rubella Immune (09/18 0000)   Varicella Not immune  RPR Nonreactive (03/04 0000)   HBsAg Negative (09/18 0000)  Hep C NR   HIV Non-reactive (03/04 0000)   GC neg  Chlamydia neg  Genetic screening cfDNA negative   1 hour GTT 109  3 hour GTT N/a  GBS Negative/-- (03/04 0000)      Plan:  Latasha Cannon was discharged to home in good condition.  Discharge Medications: Allergies as of 10/05/2022       Reactions   Biaxin [clarithromycin]    Vision problems   Buspirone Other (See Comments)   tremors   Escitalopram    Eye Problems   Prednisone    Amoxicillin Rash   Ceclor [cefaclor] Rash   Codeine Rash        Medication List     STOP taking these medications    aspirin 81 MG chewable tablet       TAKE these medications    acetaminophen 500 MG tablet Commonly known as: TYLENOL Take 1 tablet (500 mg total) by mouth every 6 (six) hours as needed.   calcium carbonate 500 MG chewable tablet Commonly known as: TUMS - dosed in mg elemental  calcium Chew 1 tablet by mouth daily.   Cholecalciferol 25 MCG (1000 UT) tablet Take 2,000 Units by mouth daily.   citalopram 20 MG tablet Commonly known as: CELEXA Take 1 tablet (20 mg total) by mouth daily.   ferrous sulfate 220 (44 Fe) MG/5ML solution Take 220 mg by mouth daily.   FIBER-CAPS PO Take by mouth.   ibuprofen 600 MG tablet Commonly known as: ADVIL Take 1 tablet (600 mg total) by mouth every 6 (six) hours as needed.   levothyroxine 100 MCG tablet Commonly known as: SYNTHROID Take 1 tablet (100 mcg total) by mouth daily before breakfast. Patient takes 150 mcg two days per week and 111mcg on the other days What changed: how much to take   loratadine 10 MG tablet Commonly known as: CLARITIN Take 10 mg by mouth.   oxyCODONE 5 MG immediate release tablet Commonly known as: Oxy IR/ROXICODONE Take 1-2 tablets (5-10 mg total) by mouth every 4 (four) hours as needed for moderate pain.   prenatal multivitamin Tabs tablet Take 1 tablet by mouth daily at  12 noon.   senna-docusate 8.6-50 MG tablet Commonly known as: Senokot-S Take 2 tablets by mouth at bedtime as needed for mild constipation.         Follow-up Information     Schermerhorn, Gwen Her, MD Follow up in 2 week(s).   Specialty: Obstetrics and Gynecology Why: post-op visit Contact information: [address] [place] [address] [phone]         [facility]. Schedule an appointment as soon as possible for a visit in 2 day(s).   Why: for a mood check Contact information: [place] [phone]                Signed: Judith Part 10/05/2022 8:27 AM

## 2022-10-04 LAB — CBC
HCT: 26.5 % — ABNORMAL LOW (ref 36.0–46.0)
Hemoglobin: 8.4 g/dL — ABNORMAL LOW (ref 12.0–15.0)
MCH: 27.2 pg (ref 26.0–34.0)
MCHC: 31.7 g/dL (ref 30.0–36.0)
MCV: 85.8 fL (ref 80.0–100.0)
Platelets: 272 10*3/uL (ref 150–400)
RBC: 3.09 MIL/uL — ABNORMAL LOW (ref 3.87–5.11)
RDW: 15.9 % — ABNORMAL HIGH (ref 11.5–15.5)
WBC: 20.4 10*3/uL — ABNORMAL HIGH (ref 4.0–10.5)
nRBC: 0 % (ref 0.0–0.2)

## 2022-10-04 LAB — CREATININE, SERUM
Creatinine, Ser: 0.86 mg/dL (ref 0.44–1.00)
GFR, Estimated: >60 mL/min (ref >60)

## 2022-10-04 MED ORDER — SODIUM CHLORIDE 0.9 % IV SOLN
300.0000 mg | Freq: Once | INTRAVENOUS | Status: AC
  Administered 2022-10-04: 300 mg via INTRAVENOUS
  Filled 2022-10-04: qty 300

## 2022-10-04 MED ORDER — SODIUM CHLORIDE 0.9 % IV SOLN: INTRAVENOUS | Status: DC | PRN

## 2022-10-04 NOTE — Lactation Note (Signed)
This note was copied from a baby's chart. Lactation Consultation Note  Patient Name: Latasha Cannon [id number] Date: 10/04/2022 Age:42 hours Reason for consult: Follow-up assessment;Primapara;Early term 37-38.6wks   Maternal Data  Set mom up for pumping breasts in initiation mode, 1-2 drops obtained  Feeding Mother's Current Feeding Choice: Breast Milk and Donor Milk Mom did not attempt breastfeeding at this feeding, noted baby rooting more after this bottlefeed.    LATCH Score                    Lactation Tools Discussed/Used Breast pump type: Double-Electric Breast Pump Reason for Pumping: to stimulate milk production and supply Pumping frequency: instructed q 3h Pumped volume:  (1-2 drops of colostrum noted in flange of pump)  Interventions  Tonight, recommended plan is to offer breast at least every other feed, especially if baby rooting and eager, with and without nipple shield as needed, limit attempts to 5-15 min depending on stamina of baby.  Call for assistance as needed.     Discharge    Consult Status Consult Status: Follow-up Date: 10/05/22 Follow-up type: In-patient    [name] 10/04/2022, 6:47 PM

## 2022-10-04 NOTE — Lactation Note (Addendum)
This note was copied from a baby's chart. Lactation Consultation Note  Patient Name: Latasha Cannon [id number] Date: 10/04/2022 Age:43 hours Reason for consult: Follow-up assessment;Primapara;Early term 37-38.6wks   Maternal Data Has patient been taught Hand Expression?: Yes Does the patient have breastfeeding experience prior to this delivery?: No  Feeding Mother's Current Feeding Choice: Breast Milk and Donor Milk Nipple Type: Slow - flow BAby has been under warmer with low temp, temp currently stable, mom  has not attempted breastfeeding since yesterday, supp with donor milk by bottle, has not pumped since yesterday,  Mom has sl firm breasts, erect nipples but sl short and firm areola, unable to elicit drops of colostrum with hand expression, pre pumped to elongate nipples, attempted latch with baby in cradle hold, on right breast, baby roots but pushes away from breast when attempting to latch, has been gaggy and is not currently opening mouth well, tight jaw, 20 mm nipple shield applied, able to latch baby but jaw tight and not deep latch, baby cries and pushes away, baby not coordinating well and beginning to tire, latch attempts ended and mom started paced bottlefeed of 20cc.  Baby sleepy during this time and needed stimulation to maintain feeding  LATCH Score Latch: Too sleepy or reluctant, no latch achieved, no sucking elicited.  Audible Swallowing: None  Type of Nipple: Everted at rest and after stimulation  Comfort (Breast/Nipple): Filling, red/small blisters or bruises, mild/mod discomfort (breasts sl firm)  Hold (Positioning): Assistance needed to correctly position infant at breast and maintain latch.  LATCH Score: 4   Lactation Tools Discussed/Used Tools: Pump Nipple shield size: 20 Breast pump type: Double-Electric Breast Pump Pump Education: Setup, frequency, and cleaning;Milk Storage Reason for Pumping: to stimulate milk production and expression Pumping  frequency: encouraged q 3h Pumped volume:  (few drops of colostrum in flanges)  Interventions Interventions: Breast feeding basics reviewed;Assisted with latch;Skin to skin;Hand express;Pre-pump if needed;Support pillows;Position options;DEBP;Education;Pace feeding LC name and no written on white board Discharge Pump: Personal;DEBP WIC Program: No  Consult Status Consult Status: Follow-up Date: 10/04/22 Follow-up type: In-patient    [name] 10/04/2022, 2:31 PM

## 2022-10-04 NOTE — Progress Notes (Signed)
Post Partum Day 1  Subjective: Doing well, no concerns. Ambulating without difficulty, pain managed with PO meds, tolerating regular diet, and voiding without difficulty.   No fever/chills, chest pain, shortness of breath, nausea/vomiting, or leg pain. No nipple or breast pain.   Objective: BP 112/66 (BP Location: Right Arm)   Pulse 97   Temp 97.6 F (36.4 C) (Oral)   Resp 18   Ht 5\' 3"  (1.6 m)   Wt 80.1 kg   SpO2 91%   Breastfeeding Unknown   BMI 31.28 kg/m    Physical Exam:  General: alert and cooperative Breasts: soft/nontender CV: RRR Pulm: nl effort Abdomen: soft, non-tender Uterine Fundus: firm Incision: dressing c/d/i Perineum: intact Lochia: appropriate DVT Evaluation: No evidence of DVT seen on physical exam. Edinburgh:      No data to display           Recent Labs    10/03/22 1157 10/04/22 0550  HGB 10.3* 8.4*  HCT 31.7* 26.5*  WBC 25.6* 20.4*  PLT 269 272    Assessment/Plan: 42 y.o. G1P1001 postpartum day # 1  [place] - 24hr Abx will end today  -Continue routine postpartum care -Lactation consult PRN for breastfeeding   -Acute blood loss anemia - hemodynamically stable and asymptomatic; given Venofer today -Immunization status: Needs varicella prior to discharge  Disposition: Continue inpatient postpartum care    LOS: 3 days   Joshau Code, CNM 10/04/2022, 10:58 AM

## 2022-10-04 NOTE — Anesthesia Postprocedure Evaluation (Signed)
Anesthesia Post Note  Patient: Latasha Cannon  Procedure(s) Performed: [facility]  Patient location during evaluation: Mother Baby Anesthesia Type: General, Spinal and Epidural Level of consciousness: awake and alert Pain management: pain level controlled Vital Signs Assessment: post-procedure vital signs reviewed and stable Respiratory status: spontaneous breathing, nonlabored ventilation and respiratory function stable Cardiovascular status: stable Postop Assessment: no headache, no backache and epidural receding Anesthetic complications: no   No notable events documented.   Last Vitals:  Vitals:   10/03/22 2306 10/04/22 0824  BP: 136/81 112/66  Pulse: (!) 120 97  Resp: 18 18  Temp: 36.6 C 36.4 C  SpO2: 97% 91%    Last Pain:  Vitals:   10/04/22 0900  TempSrc:   PainSc: 0-No pain                 Arita Miss

## 2022-10-04 NOTE — Anesthesia Post-op Follow-up Note (Signed)
  Anesthesia Pain Follow-up Note  Patient: Latasha Cannon  Day #: 1  Date of Follow-up: 10/04/2022 Time: 10:34 AM  Last Vitals:  Vitals:   10/03/22 2306 10/04/22 0824  BP: 136/81 112/66  Pulse: (!) 120 97  Resp: 18 18  Temp: 36.6 C 36.4 C  SpO2: 97% 91%    Level of Consciousness: alert  Pain: mild   Side Effects:None  Catheter Site Exam:clean  Anti-Coag Meds (From admission, onward)    Start     Dose/Rate Route Frequency Ordered Stop   10/04/22 0800  enoxaparin (LOVENOX) injection 40 mg        40 mg Subcutaneous Every 24 hours 10/03/22 1139          Plan: D/C from anesthesia care at surgeon's request  Arita Miss

## 2022-10-05 LAB — CREATININE, SERUM
Creatinine, Ser: 0.66 mg/dL (ref 0.44–1.00)
GFR, Estimated: >60 mL/min (ref >60)

## 2022-10-05 MED ORDER — OXYCODONE HCL 5 MG PO TABS: 5.0000 mg | ORAL_TABLET | ORAL | 0 refills | Status: DC | PRN

## 2022-10-05 MED ORDER — LEVOTHYROXINE SODIUM 100 MCG PO TABS: 100.0000 ug | ORAL_TABLET | Freq: Every day | ORAL | 6 refills | Status: DC

## 2022-10-05 MED ORDER — SENNOSIDES-DOCUSATE SODIUM 8.6-50 MG PO TABS: 2.0000 | ORAL_TABLET | Freq: Every evening | ORAL | Status: DC | PRN

## 2022-10-05 MED ORDER — IBUPROFEN 600 MG PO TABS: 600.0000 mg | ORAL_TABLET | Freq: Four times a day (QID) | ORAL | Status: DC | PRN

## 2022-10-05 MED ORDER — ACETAMINOPHEN 500 MG PO TABS: 500.0000 mg | ORAL_TABLET | Freq: Four times a day (QID) | ORAL | Status: DC | PRN

## 2022-10-05 NOTE — Lactation Note (Signed)
This note was copied from a baby's chart. Lactation Consultation Note  Patient Name: Latasha Cannon [id number] Date: 10/05/2022 Age:43 hours Reason for consult: Follow-up assessment;Primapara;Early term 37-38.6wks;Difficult latch;Other (Comment) (Early term feeding plan)   Maternal Data This is mom's 1st baby, C/S for arrest of dilation. Mom with a history of anemia, AMA, elevated BP in 3rd trimester, anxiety, thyroidectomy for stage 3 thyroid cancer, hypothyroidism in pregnancy(on synthroid).  On follow-up today parents report baby has been bottle feeding donor milk about every 3 hours. Per parents baby taking 30 ml's in 56 minutes . Per dad baby was able to take about 25 ml's in 40 minutes. Mom has not pumped since yesterday but reports she is going to start pumping again today. Parents with questions regarding: what to do to obtain a pediatric outpatient appointment, burping techniques, techniques to calm baby, and swaddling.They also would like to begin offering baby formula prior to going home.    Has patient been taught Hand Expression?: Yes Does the patient have breastfeeding experience prior to this delivery?: No  Feeding Mother's Current Feeding Choice: Breast Milk and Formula At this time baby is bottle feeding donor milk. Mom has not attempted breastfeeding since yesterday with LC. Mom reports she plans on pumping today. Discussed with parents length of feeding. Discussed after 30-40 minutes the baby may be expending calories. Recommended parents goal to provide bottle feed in 30 minutes with a max time of 40 minutes. Discussed some feeds baby may take more and some less and to goal for 20- 30 ml's for today and after the baby is 72 hours of life of goal for 30 ml's knowing the baby may take more.   Lactation Tools Discussed/Used  Mom reports she is comfortable with use of DEBP. Mom verbalized understanding to call [facility] if she has any additional questions or needs assistance with  pumping.  Interventions Interventions: Education (Reviewed early term feeding plan)  Discharge Discharge Education: Warning signs for feeding baby;Outpatient recommendation Pump: Personal  Consult Status Consult Status: Follow-up Date: 10/05/22 Follow-up type: In-patient  Update provided to care nurse and NP.  Jonna Shamra Bradeen 10/05/2022, 12:59 PM

## 2022-10-05 NOTE — Clinical Social Work Maternal (Signed)
CLINICAL SOCIAL WORK MATERNAL/CHILD NOTE  Patient Details  Name: Latasha Cannon MRN: [id number] Date of Birth: 1981-04-27  Date:  10/05/2022  Clinical Social Worker Initiating Note:  Raina Mina, [place] Date/Time: Initiated:  10/05/22/1345     Child's Name:  Latasha Cannon   Biological Parents:  Mother, Father   Need for Interpreter:  None   Reason for Referral:   Flavia Shipper Scale)   Address:  [place] [address]    Phone number:  [phone] (home) [phone] (work)    Additional phone number:   Household Members/Support Persons (HM/SP):   Household Member/Support Person 1   HM/SP Name Relationship DOB or Age  HM/SP -1 Latasha Cannon Husband 03/13/1979  HM/SP -2        HM/SP -3        HM/SP -4        HM/SP -5        HM/SP -6        HM/SP -7        HM/SP -8          Natural Supports (not living in the home):  Parent   Professional Supports: None   Employment: Full-time   Type of Work: Press photographer   Education:  Forensic psychologist   Homebound arranged:    Museum/gallery curator Resources:  Multimedia programmer    Other Resources:      Cultural/Religious Considerations Which May Impact Care:  N/A  Strengths:  Ability to meet basic needs     Psychotropic Medications:         Pediatrician:     Estate agent  Pediatrician List:   [facility]      Pediatrician Fax Number: [phone]  Risk Factors/Current Problems:  None   Cognitive State:  Alert     Mood/Affect:  Calm     CSW Assessment: CSW Assessment: CSW received a consult for?MOB's Edinburgh Postnatal Depression Scale results.   ??   CSW spoke with MOB and explained CSW's role and reason for referral.   ?   MOB reported she is feeling?good?post-delivery. MOB was alert,?appropriate?during the assessment.    ?   Confirmed contact information for MOB. MOB and Baby will be living  with?FOB (Latasha Karge)?at discharge.    ??   MOB reported she doesn't qualify for [facility] or food stamps.       MOB plans on using the [facility] in [place] for the baby's pediatrician.        MOB reported she has a crib,?bassinet, car seat (new), clothing, diapers, and all other items needed for Baby.       MOB reported she has?a history of anxiety. MOB states she and FOB have anxiety and they both take anxiety medication. MOB stated she spoke with her midwife who stated they plan on discussing her Flavia Shipper scale further on Tuesday and to access for post-partum. MOB stated her delivery was unexpected and she feels better today than yesterday. MOB stated she hopes she will get into a routine when she discharges home. MOB denied the need for any counseling or therapy. MOB understands when she is overwhelmed to reach out to her support system.  MOB has support from her mother and husband. MOB denied SI, HI, or DV. MOB denied the need for mental health support resources and reported she is aware of them  if needed.   ?   CSW provided education and information sheets on PPD and SIDS. MOB verbalized understanding. CSW encouraged MOB to reach out to her Provider with any questions or needs for support or resources, even after discharge.    ?   MOB denied any needs or questions. CSW encouraged MOB to reach out if any arise prior to discharge.    ?      CSW Plan/Description:??No further barriers for discharge.    CSW Plan/Description:  No Further Intervention Required/No Barriers to Discharge    Raina Mina, [place] 10/05/2022, 5:10 PM

## 2022-10-15 ENCOUNTER — Ambulatory Visit: Payer: Self-pay

## 2022-10-15 NOTE — Lactation Note (Addendum)
This note was copied from a baby's chart. Lactation Consultation Note  Patient Name: Latasha Cannon [id number] Date: 10/15/2022 Age:43 days Reason for consult: Mother's request;Primapara;Early term 37-38.6wks;Follow-up assessment;Other (Comment) (maternal lactation insufficiency)   Maternal Data This is mom's 1st baby, C/S for arrest of dilation. Mom with a history of anemia, AMA, elevated BP in 3rd trimester, anxiety, thyroidectomy for stage 3 thyroid cancer, hypothyroidism in pregnancy(on synthroid). At discharge from hospital baby was bottle feeding formula with Dr.Brown's bottle. Early term feeding plan had been initiation on day 1 of life for baby with inconsistent breastfeeding. Mom had been initiated breastpumping and was inconsistently pumping in the hospital. On day of discharge per mom her plan was to use her personal pump when she was settled at home, baby would bottle feed and she would revisit breastfeed at a later date.    At today's visit baby is 43 days old. Mom reports she has low milk supply. She has been pumping 4 times in 24 hours the past 4-5 days producing a total of 30 ml's in 24 hours.Mom reports when she first got home she pumped a few times in the first 7 days but did not get any measurable amounts.Per mom she has tried breastfeeding a few times. Baby will latch but gets fussy at the breast and will not maintain a latch as mom's states , "she knows nothing is coming out."  Mom is here today to inquire how to increase her milk supply and attempt a breastfeeding session with [place] assistance.  Mom reports she was told 6 weeks before delivery importance of keeping her levels related to her thyroid as close to 0 as possible due to recent markers being followed  related to her history of thyroid cancer. Discussed with mom role of thyroid hormones and how milk supply can be affected. Discussed with mom some women may not see improvement to their milk supply until their thyroid level is at  the upper part of normal. As mom reports this is not an option for her she will discuss further with her endocrinologist. Discussed with mom opportunities to potentially improve her supply with increasing number of pump sessions in 24 hrs(goal 8 /24 hours), using a hospital grade DEBP maximizing stimulation/expression phases of pump, increasing fluid intake, breast massage/compression when pumping, encouraging baby to latch and breastfeed a few times per day and post pumping after breastfeeding attempts.  Discussed with parents any milk mom is able to pump is helpful to the baby. If mom finds she is not able to pump more she will still be able to provide the milk she is pumping for her baby. Support and encouragement provided.   Has patient been taught Hand Expression?: Yes Does the patient have breastfeeding experience prior to this delivery?: No  Feeding Mother's Current Feeding Choice: Breast Milk and Formula Nipple Type: Slow - flow Baby's weight today is 2762 grams(birth weight 2730 grams) Baby did briefly latch directly on to mom's nipple but readily detached and began crying. Nipple shield used with drips of formula to encourage baby to latch. Baby did latch and some audible swallows noted. Baby became fussy. A few sips provided by bottle to calm baby. Baby relatched and breastfed with use of nipple shield for a total of 15 minutes softening mom's breast. By pre/post weight baby transferred 10 ml's after subtracting small amount taken by bottle to calm baby before relatching.Dad bottle fed baby while mom pumped. Baby took 60 ml's by bottle in 12 minutes.  Total feed was 70 ml's. Per parents baby is often appearing hungry after taking 40 ml's. Discussed following baby's cues for satiety. Parents will discuss with Pediatrician tomorrow at baby's follow-up Visit.  LATCH Score Latch: Grasps breast easily, tongue down, lips flanged, rhythmical sucking. (with use of nipple shield)  Audible  Swallowing: A few with stimulation  Type of Nipple: Everted at rest and after stimulation  Comfort (Breast/Nipple): Filling, red/small blisters or bruises, mild/mod discomfort  Hold (Positioning): Assistance needed to correctly position infant at breast and maintain latch.  LATCH Score: 7   Lactation Tools Discussed/Used Tools: Nipple [place] Fuel;Other (comment) (Medela Baby Weigh Scale) Nipple shield size: 20 Breast pump type: Double-Electric Breast Pump Pump Education: Other (comment) (Tips and strategiesto maximize milk production) Reason for Pumping: to assess milk available in the breast, stimulation, and emptying Pumping frequency: Mom has been pumping 4 times in 24 hours for the past 4-5 days. Pumped volume: 6 mL  Interventions Interventions: Assisted with latch;Breast massage;Breast compression;Support pillows;DEBP;Education, Provided tips and strategies to maximize milk production. Provided instructions via AVS. Reviewed with parents who verbalized understanding and are in agreement with plan.  Discharge Discharge Education: Other (comment) (Pumping plan to optimize maximum milk production, feeding plan to include breastfeeding with use of nipple shield in combination with formula supplementation.) Pump: Personal (Mom using single hands free battery operated personal pump and manual harmony pump.)  Consult Status Consult Status: PRN Date: 10/15/22 Follow-up type: Out-patient  Outpatient lactation order referral form faxed to Dr. Maretta Los at [facility].  Jonna Kingjames Coury 10/15/2022, 4:27 PM

## 2022-11-03 ENCOUNTER — Telehealth: Payer: Self-pay

## 2022-11-03 NOTE — Telephone Encounter (Signed)
[facility]- Discharge Call Backs-Left pt a VM about the following below. 1-Do you have any questions or concerns about yourself as you heal? 2-Any concerns or questions about your baby? 3-How was your stay at the hospital? 5-How did our team work together to care for you? You should be receiving a survey in the mail soon.   We would really appreciate it if you could fill that out for [place] and return it in the mail.  We value the feedback to make improvements and continue the great work we do.   If you have any questions please feel free to call me back at [phone]

## 2023-01-28 ENCOUNTER — Encounter: Payer: Self-pay | Admitting: Dermatology

## 2023-01-28 ENCOUNTER — Ambulatory Visit (INDEPENDENT_AMBULATORY_CARE_PROVIDER_SITE_OTHER): Payer: BC Managed Care – PPO | Admitting: Dermatology

## 2023-01-28 VITALS — BP 121/81 | HR 72

## 2023-01-28 DIAGNOSIS — L814 Other melanin hyperpigmentation: Secondary | ICD-10-CM | POA: Diagnosis not present

## 2023-01-28 DIAGNOSIS — L858 Other specified epidermal thickening: Secondary | ICD-10-CM

## 2023-01-28 DIAGNOSIS — Z1283 Encounter for screening for malignant neoplasm of skin: Secondary | ICD-10-CM

## 2023-01-28 DIAGNOSIS — L821 Other seborrheic keratosis: Secondary | ICD-10-CM | POA: Diagnosis not present

## 2023-01-28 DIAGNOSIS — D1801 Hemangioma of skin and subcutaneous tissue: Secondary | ICD-10-CM | POA: Diagnosis not present

## 2023-01-28 DIAGNOSIS — D229 Melanocytic nevi, unspecified: Secondary | ICD-10-CM

## 2023-01-28 DIAGNOSIS — D2361 Other benign neoplasm of skin of right upper limb, including shoulder: Secondary | ICD-10-CM

## 2023-01-28 DIAGNOSIS — Z86006 Personal history of melanoma in-situ: Secondary | ICD-10-CM

## 2023-01-28 DIAGNOSIS — Z86018 Personal history of other benign neoplasm: Secondary | ICD-10-CM

## 2023-01-28 DIAGNOSIS — D239 Other benign neoplasm of skin, unspecified: Secondary | ICD-10-CM

## 2023-01-28 DIAGNOSIS — L578 Other skin changes due to chronic exposure to nonionizing radiation: Secondary | ICD-10-CM

## 2023-01-28 DIAGNOSIS — R238 Other skin changes: Secondary | ICD-10-CM

## 2023-01-28 DIAGNOSIS — W908XXA Exposure to other nonionizing radiation, initial encounter: Secondary | ICD-10-CM

## 2023-01-28 DIAGNOSIS — Z85820 Personal history of malignant melanoma of skin: Secondary | ICD-10-CM

## 2023-01-28 NOTE — Progress Notes (Signed)
Follow-Up Visit   Subjective  Latasha Cannon is a 42 y.o. female who presents for the following: Skin Cancer Screening and Full Body Skin Exam The patient presents for Total-Body Skin Exam (TBSE) for skin cancer screening and mole check. The patient has spots, moles and lesions to be evaluated, some may be new or changing and the patient may have concern these could be cancer.  The following portions of the chart were reviewed this encounter and updated as appropriate: medications, allergies, medical history  Review of Systems:  No other skin or systemic complaints except as noted in HPI or Assessment and Plan.  Objective  Well appearing patient in no apparent distress; mood and affect are within normal limits.  A full examination was performed including scalp, head, eyes, ears, nose, lips, neck, chest, axillae, abdomen, back, buttocks, bilateral upper extremities, bilateral lower extremities, hands, feet, fingers, toes, fingernails, and toenails. All findings within normal limits unless otherwise noted below.   Relevant physical exam findings are noted in the Assessment and Plan.         Assessment & Plan   SKIN CANCER SCREENING PERFORMED TODAY.  ACTINIC DAMAGE - Chronic condition, secondary to cumulative UV/sun exposure - diffuse scaly erythematous macules with underlying dyspigmentation - Recommend daily broad spectrum sunscreen SPF 30+ to sun-exposed areas, reapply every 2 hours as needed.  - Staying in the shade or wearing long sleeves, sun glasses (UVA+UVB protection) and wide brim hats (4-inch brim around the entire circumference of the hat) are also recommended for sun protection.  - Call for new or changing lesions.  LENTIGINES, SEBORRHEIC KERATOSES, HEMANGIOMAS - Benign normal skin lesions - Benign-appearing - Call for any changes  MELANOCYTIC NEVI Multiple locations see history  - Tan-brown and/or pink-flesh-colored symmetric macules and papules - Benign  appearing on exam today - Observation - Call clinic for new or changing moles - Recommend daily use of broad spectrum spf 30+ sunscreen to sun-exposed areas.   Nevus vs dyschromia from previous trauma? left middle medial sole of foot See photos above  0.2 cm light brown macule  Benign-appearing.  Observation.  Call clinic for new or changing lesions.  Recommend daily use of broad spectrum spf 30+ sunscreen to sun-exposed areas  HISTORY OF DYSPLASTIC NEVUS No evidence of recurrence today Recommend regular full body skin exams Recommend daily broad spectrum sunscreen SPF 30+ to sun-exposed areas, reapply every 2 hours as needed.  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  - Call if any new or changing lesions are noted between office visits  DERMATOFIBROMA Right mid dorsum forearm Exam: 4 mm Firm pink/brown papulenodule with dimple sign. Treatment Plan: A dermatofibroma is a benign growth possibly related to trauma, such as an insect bite, cut from shaving, or inflamed acne-type bump.  Treatment options to remove include shave or excision with resulting scar and risk of recurrence.  Since benign-appearing and not bothersome, will observe for now.   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 Genetic in nature. No cure. - Observe. - If desired, patient can use an emollient (moisturizer) containing ammonium lactate (AmLactin), urea or salicylic acid once  a day to smooth the area  Recommend starting moisturizer with exfoliant (Urea, Salicylic acid, or Lactic acid) one to two times daily to help smooth rough and bumpy skin.  OTC options  include Cetaphil Rough and Bumpy lotion (Urea), Eucerin Roughness Relief lotion or spot treatment cream (Urea), CeraVe SA lotion/cream for Rough and Bumpy skin (Sal Acid), Gold Bond Rough and Bumpy cream (Sal Acid), and AmLactin 12% lotion/cream (Lactic Acid).  If applying in morning, also apply sunscreen to sun-exposed areas, since these exfoliating moisturizers can increase sensitivity to sun.   Return in about 4 months (around 05/31/2023) for TBSE.  IAsher Muir, CMA, am acting as scribe for Armida Sans, MD.  Documentation: I have reviewed the above documentation for accuracy and completeness, and I agree with the above.  Armida Sans, MD

## 2023-01-28 NOTE — Patient Instructions (Addendum)
     Melanoma ABCDEs  Melanoma is the most dangerous type of skin cancer, and is the leading cause of death from skin disease.  You are more likely to develop melanoma if you: Have light-colored skin, light-colored eyes, or red or blond hair Spend a lot of time in the sun Tan regularly, either outdoors or in a tanning bed Have had blistering sunburns, especially during childhood Have a close family member who has had a melanoma Have atypical moles or large birthmarks  Early detection of melanoma is key since treatment is typically straightforward and cure rates are extremely high if we catch it early.   The first sign of melanoma is often a change in a mole or a new dark spot.  The ABCDE system is a way of remembering the signs of melanoma.  A for asymmetry:  The two halves do not match. B for border:  The edges of the growth are irregular. C for color:  A mixture of colors are present instead of an even brown color. D for diameter:  Melanomas are usually (but not always) greater than 6mm - the size of a pencil eraser. E for evolution:  The spot keeps changing in size, shape, and color.  Please check your skin once per month between visits. You can use a small mirror in front and a large mirror behind you to keep an eye on the back side or your body.   If you see any new or changing lesions before your next follow-up, please call to schedule a visit.  Please continue daily skin protection including broad spectrum sunscreen SPF 30+ to sun-exposed areas, reapplying every 2 hours as needed when you're outdoors.   Staying in the shade or wearing long sleeves, sun glasses (UVA+UVB protection) and wide brim hats (4-inch brim around the entire circumference of the hat) are also recommended for sun protection.    Due to recent changes in healthcare laws, you may see results of your pathology and/or laboratory studies on MyChart before the doctors have had a chance to review them. We  understand that in some cases there may be results that are confusing or concerning to you. Please understand that not all results are received at the same time and often the doctors may need to interpret multiple results in order to provide you with the best plan of care or course of treatment. Therefore, we ask that you please give us 2 business days to thoroughly review all your results before contacting the office for clarification. Should we see a critical lab result, you will be contacted sooner.   If You Need Anything After Your Visit  If you have any questions or concerns for your doctor, please call our main line at [phone] and press option 4 to reach your doctor's medical assistant. If no one answers, please leave a voicemail as directed and we will return your call as soon as possible. Messages left after 4 pm will be answered the following business day.   You may also send us a message via MyChart. We typically respond to MyChart messages within 1-2 business days.  For prescription refills, please ask your pharmacy to contact our office. Our fax number is [phone].  If you have an urgent issue when the clinic is closed that cannot wait until the next business day, you can page your doctor at the number below.    Please note that while we do our best to be available for urgent issues   outside of office hours, we are not available 24/7.   If you have an urgent issue and are unable to reach us, you may choose to seek medical care at your doctor's office, retail clinic, urgent care center, or emergency room.  If you have a medical emergency, please immediately call 911 or go to the emergency department.  Pager Numbers  - Dr. Kowalski: [phone]  - Dr. Moye: [phone]  - Dr. Stewart: [phone]  In the event of inclement weather, please call our main line at [phone] for an update on the status of any delays or closures.  Dermatology Medication Tips: Please  keep the boxes that topical medications come in in order to help keep track of the instructions about where and how to use these. Pharmacies typically print the medication instructions only on the boxes and not directly on the medication tubes.   If your medication is too expensive, please contact our office at [phone] option 4 or send us a message through MyChart.   We are unable to tell what your co-pay for medications will be in advance as this is different depending on your insurance coverage. However, we may be able to find a substitute medication at lower cost or fill out paperwork to get insurance to cover a needed medication.   If a prior authorization is required to get your medication covered by your insurance company, please allow us 1-2 business days to complete this process.  Drug prices often vary depending on where the prescription is filled and some pharmacies may offer cheaper prices.  The website www.goodrx.com contains coupons for medications through different pharmacies. The prices here do not account for what the cost may be with help from insurance (it may be cheaper with your insurance), but the website can give you the price if you did not use any insurance.  - You can print the associated coupon and take it with your prescription to the pharmacy.  - You may also stop by our office during regular business hours and pick up a GoodRx coupon card.  - If you need your prescription sent electronically to a different pharmacy, notify our office through [facility] MyChart or by phone at [phone] option 4.     Si Usted Necesita Algo Despus de Su Visita  Tambin puede enviarnos un mensaje a travs de MyChart. Por lo general respondemos a los mensajes de MyChart en el transcurso de 1 a 2 das hbiles.  Para renovar recetas, por favor pida a su farmacia que se ponga en contacto con nuestra oficina. Nuestro nmero de fax es el [phone].  Si tiene un asunto urgente  cuando la clnica est cerrada y que no puede esperar hasta el siguiente da hbil, puede llamar/localizar a su doctor(a) al nmero que aparece a continuacin.   Por favor, tenga en cuenta que aunque hacemos todo lo posible para estar disponibles para asuntos urgentes fuera del horario de oficina, no estamos disponibles las 24 horas del da, los 7 das de la semana.   Si tiene un problema urgente y no puede comunicarse con nosotros, puede optar por buscar atencin mdica  en el consultorio de su doctor(a), en una clnica privada, en un centro de atencin urgente o en una sala de emergencias.  Si tiene una emergencia mdica, por favor llame inmediatamente al 911 o vaya a la sala de emergencias.  Nmeros de bper  - Dr. Kowalski: [phone]  - Dra. Moye: [phone]  - Dra. Stewart: [phone]  En caso   de inclemencias del tiempo, por favor llame a nuestra lnea principal al [phone] para una actualizacin sobre el estado de cualquier retraso o cierre.  Consejos para la medicacin en dermatologa: Por favor, guarde las cajas en las que vienen los medicamentos de uso tpico para ayudarle a seguir las instrucciones sobre dnde y cmo usarlos. Las farmacias generalmente imprimen las instrucciones del medicamento slo en las cajas y no directamente en los tubos del medicamento.   Si su medicamento es muy caro, por favor, pngase en contacto con nuestra oficina llamando al [phone] y presione la opcin 4 o envenos un mensaje a travs de MyChart.   No podemos decirle cul ser su copago por los medicamentos por adelantado ya que esto es diferente dependiendo de la cobertura de su seguro. Sin embargo, es posible que podamos encontrar un medicamento sustituto a menor costo o llenar un formulario para que el seguro cubra el medicamento que se considera necesario.   Si se requiere una autorizacin previa para que su compaa de seguros cubra su medicamento, por favor permtanos de 1 a 2  das hbiles para completar este proceso.  Los precios de los medicamentos varan con frecuencia dependiendo del lugar de dnde se surte la receta y alguna farmacias pueden ofrecer precios ms baratos.  El sitio web www.goodrx.com tiene cupones para medicamentos de diferentes farmacias. Los precios aqu no tienen en cuenta lo que podra costar con la ayuda del seguro (puede ser ms barato con su seguro), pero el sitio web puede darle el precio si no utiliz ningn seguro.  - Puede imprimir el cupn correspondiente y llevarlo con su receta a la farmacia.  - Tambin puede pasar por nuestra oficina durante el horario de atencin regular y recoger una tarjeta de cupones de GoodRx.  - Si necesita que su receta se enve electrnicamente a una farmacia diferente, informe a nuestra oficina a travs de MyChart de [facility] o por telfono llamando al [phone] y presione la opcin 4.  

## 2023-01-30 ENCOUNTER — Encounter: Payer: Self-pay | Admitting: Dermatology

## 2023-05-07 ENCOUNTER — Other Ambulatory Visit (HOSPITAL_COMMUNITY)
Admission: RE | Admit: 2023-05-07 | Discharge: 2023-05-07 | Disposition: A | Discharge status: Home / Self Care | Payer: BC Managed Care – PPO | Source: Ambulatory Visit | Attending: Family Medicine | Admitting: Family Medicine

## 2023-05-07 ENCOUNTER — Encounter: Payer: Self-pay | Admitting: Family Medicine

## 2023-05-07 ENCOUNTER — Ambulatory Visit: Payer: BC Managed Care – PPO | Admitting: Family Medicine

## 2023-05-07 VITALS — BP 105/71 | HR 82 | Temp 98.6°F | Ht 63.0 in | Wt 145.5 lb

## 2023-05-07 DIAGNOSIS — F419 Anxiety disorder, unspecified: Secondary | ICD-10-CM

## 2023-05-07 DIAGNOSIS — E890 Postprocedural hypothyroidism: Secondary | ICD-10-CM

## 2023-05-07 DIAGNOSIS — Z124 Encounter for screening for malignant neoplasm of cervix: Secondary | ICD-10-CM | POA: Insufficient documentation

## 2023-05-07 DIAGNOSIS — Z23 Encounter for immunization: Secondary | ICD-10-CM

## 2023-05-07 DIAGNOSIS — Z0001 Encounter for general adult medical examination with abnormal findings: Secondary | ICD-10-CM | POA: Diagnosis not present

## 2023-05-07 DIAGNOSIS — Z0000 Encounter for general adult medical examination without abnormal findings: Secondary | ICD-10-CM

## 2023-05-07 DIAGNOSIS — Z13220 Encounter for screening for lipoid disorders: Secondary | ICD-10-CM

## 2023-05-07 DIAGNOSIS — Z1231 Encounter for screening mammogram for malignant neoplasm of breast: Secondary | ICD-10-CM

## 2023-05-07 MED ORDER — CITALOPRAM HYDROBROMIDE 20 MG PO TABS: 20.0000 mg | ORAL_TABLET | Freq: Every day | ORAL | 3 refills | Status: DC

## 2023-05-07 MED ORDER — NORETHINDRONE 0.35 MG PO TABS: 1.0000 | ORAL_TABLET | Freq: Every day | ORAL | 3 refills | Status: DC

## 2023-05-07 NOTE — Progress Notes (Signed)
Complete physical exam  Patient: Latasha Cannon   DOB: Jul 23, 1980   42 y.o. Female  MRN: [id number]  Subjective:    Chief Complaint  Patient presents with   Annual Exam    Annual physical    Latasha Cannon is a 42 y.o. female who presents today for a complete physical exam. She reports consuming a general diet.  She generally feels well. She reports sleeping well. She does not have additional problems to discuss today.   Discussed the use of AI scribe software for clinical note transcription with the patient, who gave verbal consent to proceed.  History of Present Illness   The patient, a woman in her 9s with a history of thyroid cancer and vaginismus, presents for a routine check-up. She recently had a baby and has been experiencing several postpartum changes. She reports feeling fatigued and having mood changes, which she attributes to the stress of adjusting to life with a new baby and returning to work. She also reports having heavy periods since giving birth.  The patient's thyroid cancer marker levels have increased recently, which has caused her some concern. She is being monitored closely by her endocrinologist and is undergoing regular blood tests and neck ultrasounds.  The patient also reports difficulties with intercourse due to vaginismus. She is currently using condoms for contraception but is considering switching to a progesterone-only pill due to concerns about stroke risk with estrogen-containing pills.        Most recent fall risk assessment:    05/07/2023    8:37 AM  Fall Risk   Falls in the past year? 0  Number falls in past yr: 0  Injury with Fall? 0  Risk for fall due to : No Fall Risks  Follow up Falls evaluation completed     Most recent depression screenings:    05/07/2023    8:37 AM 10/07/2021   11:22 AM  PHQ 2/9 Scores  PHQ - 2 Score 0 2  PHQ- 9 Score 1 3        Patient Care Team: [place] Searles, Marzella Schlein, MD as PCP - General (Family  Medicine) Malva Limes, MD as Referring Physician (Family Medicine) Kieth Brightly, MD (General Surgery)   Outpatient Medications Prior to Visit  Medication Sig   Cholecalciferol 50 MCG (2000 UT) TABS Take by mouth.   levothyroxine (SYNTHROID) 88 MCG tablet Take 88 mcg by mouth daily before breakfast.   [DISCONTINUED] citalopram (CELEXA) 20 MG tablet Take 1 tablet (20 mg total) by mouth daily.   No facility-administered medications prior to visit.    ROS     Objective:     BP 105/71   Pulse 82   Temp 98.6 F (37 C) (Oral)   Ht 5\' 3"  (1.6 m)   Wt 145 lb 8 oz (66 kg)   SpO2 100%   BMI 25.77 kg/m    Physical Exam Vitals reviewed.  Constitutional:      General: She is not in acute distress.    Appearance: Normal appearance. She is well-developed. She is not diaphoretic.  HENT:     Head: Normocephalic and atraumatic.     Right Ear: Tympanic membrane, ear canal and external ear normal.     Left Ear: Tympanic membrane, ear canal and external ear normal.     Nose: Nose normal.     Mouth/Throat:     Mouth: Mucous membranes are moist.     Pharynx: Oropharynx is clear. No oropharyngeal exudate.  Eyes:     General: No scleral icterus.    Conjunctiva/sclera: Conjunctivae normal.     Pupils: Pupils are equal, round, and reactive to light.  Neck:     Thyroid: No thyromegaly.  Cardiovascular:     Rate and Rhythm: Normal rate and regular rhythm.     Heart sounds: Normal heart sounds. No murmur heard. Pulmonary:     Effort: Pulmonary effort is normal. No respiratory distress.     Breath sounds: Normal breath sounds. No wheezing or rales.  Abdominal:     General: There is no distension.     Palpations: Abdomen is soft.     Tenderness: There is no abdominal tenderness.  Genitourinary:    Comments: GYN:  External genitalia within normal limits.  Vaginal mucosa pink, moist, normal rugae.  Vaginismus.  Nonfriable cervix without lesions, no discharge or bleeding noted  on speculum exam.   Musculoskeletal:        General: No deformity.     Cervical back: Neck supple.     Right lower leg: No edema.     Left lower leg: No edema.  Lymphadenopathy:     Cervical: No cervical adenopathy.  Skin:    General: Skin is warm and dry.     Findings: No rash.  Neurological:     Mental Status: She is alert and oriented to person, place, and time. Mental status is at baseline.     Gait: Gait normal.  Psychiatric:        Mood and Affect: Mood normal.        Behavior: Behavior normal.        Thought Content: Thought content normal.      No results found for any visits on 05/07/23.     Assessment & Plan:    Routine Health Maintenance and Physical Exam  Immunization History  Administered Date(s) Administered   Influenza, Seasonal, Injecte, Preservative Fre 05/07/2023   Influenza,inj,Quad PF,6+ Mos 04/20/2019, 04/11/2020, 04/24/2021, 04/09/2022   Influenza-Unspecified 04/20/2018   MMR 06/20/1996   PFIZER(Purple Top)SARS-COV-2 Vaccination 09/30/2019, 10/26/2019, 06/22/2020   Pfizer(Comirnaty)Fall Seasonal Vaccine 12 years and older 04/22/2023   Rsv, Bivalent, Protein Subunit Rsvpref,pf Verdis Frederickson) 08/28/2022   Tdap 02/22/2015, 07/31/2022   Varicella 06/20/1996, 07/25/1996, 10/05/2022    Health Maintenance  Topic Date Due   Cervical Cancer Screening (HPV/Pap Cotest)  02/24/2021   COVID-19 Vaccine (5 - 2023-24 season) 06/17/2023   DTaP/Tdap/Td (3 - Td or Tdap) 07/31/2032   INFLUENZA VACCINE  Completed   Hepatitis C Screening  Completed   HIV Screening  Completed   HPV VACCINES  Aged Out    Discussed health benefits of physical activity, and encouraged her to engage in regular exercise appropriate for her age and condition.  Problem List Items Addressed This Visit       Endocrine   Post-surgical hypothyroidism     Other   Anxiety    Well controlled Continue Celexa 20mg  daily      Relevant Medications   citalopram (CELEXA) 20 MG tablet    Other Visit Diagnoses     Encounter for annual physical exam    -  Primary   Relevant Orders   Comprehensive metabolic panel   Lipid panel   Breast cancer screening by mammogram       Relevant Orders   MM 3D SCREENING MAMMOGRAM BILATERAL BREAST   Cervical cancer screening       Relevant Orders   Cytology - PAP   Screening for lipid  disorders       Relevant Orders   Comprehensive metabolic panel   Lipid panel   Immunization due       Relevant Orders   Flu vaccine trivalent PF, 6mos and older(Flulaval,Afluria,Fluarix,Fluzone) (Completed)   Influenza vaccine needed       Relevant Orders   Flu vaccine trivalent PF, 6mos and older(Flulaval,Afluria,Fluarix,Fluzone) (Completed)           Vaginismus Difficulty with Pap smear due to vaginal dryness postpartum. Improved moisture noted.  Postpartum contraception Patient stopped birth control due to potential stroke risk. Currently using condoms but experiencing difficulties due to vaginismus. -Start progesterone-only mini pill, taken continuously to avoid periods.  Thyroid cancer Elevated cancer marker during pregnancy, stable postpartum. Patient experiencing stress related to this issue. -Continue monitoring with Dr. Tedd Sias, including yearly neck ultrasounds and biannual blood work.  Anemia postpartum Resolved after iron infusion. -No further intervention needed at this time.  General Health Maintenance -Administer influenza vaccine today. -Order mammogram. -Check cholesterol, kidney, and liver function today. -Continue Citalopram 20mg  daily for stress management, refill for 3 months. -Advise patient to continue taking Vitamin D, no need for multivitamin or iron supplement.       Return in about 1 year (around 05/06/2024) for CPE.     Shirlee Latch, MD

## 2023-05-07 NOTE — Assessment & Plan Note (Signed)
Well controlled Continue Celexa 20mg  daily

## 2023-05-07 NOTE — Patient Instructions (Signed)
Call [facility] to schedule a mammogram [phone]

## 2023-05-08 LAB — COMPREHENSIVE METABOLIC PANEL
ALT: 18 [IU]/L (ref 0–32)
AST: 13 [IU]/L (ref 0–40)
Albumin: 4.7 g/dL (ref 3.9–4.9)
Alkaline Phosphatase: 81 [IU]/L (ref 44–121)
BUN/Creatinine Ratio: 15 (ref 9–23)
BUN: 11 mg/dL (ref 6–24)
Bilirubin Total: 0.3 mg/dL (ref 0.0–1.2)
CO2: 22 mmol/L (ref 20–29)
Calcium: 9.6 mg/dL (ref 8.7–10.2)
Chloride: 104 mmol/L (ref 96–106)
Creatinine, Ser: 0.74 mg/dL (ref 0.57–1.00)
Globulin, Total: 2.3 g/dL (ref 1.5–4.5)
Glucose: 93 mg/dL (ref 70–99)
Potassium: 4 mmol/L (ref 3.5–5.2)
Sodium: 140 mmol/L (ref 134–144)
Total Protein: 7 g/dL (ref 6.0–8.5)
eGFR: 104 mL/min/{1.73_m2} (ref >59)

## 2023-05-08 LAB — LIPID PANEL
Chol/HDL Ratio: 4.1 {ratio} (ref 0.0–4.4)
Cholesterol, Total: 165 mg/dL (ref 100–199)
HDL: 40 mg/dL (ref >39)
LDL Chol Calc (NIH): 102 mg/dL — ABNORMAL HIGH (ref 0–99)
Triglycerides: 128 mg/dL (ref 0–149)
VLDL Cholesterol Cal: 23 mg/dL (ref 5–40)

## 2023-05-12 LAB — CYTOLOGY - PAP
Comment: NEGATIVE
Diagnosis: NEGATIVE
High risk HPV: NEGATIVE

## 2023-05-20 ENCOUNTER — Encounter: Payer: Self-pay | Admitting: Radiology

## 2023-05-20 ENCOUNTER — Ambulatory Visit
Admission: RE | Admit: 2023-05-20 | Discharge: 2023-05-20 | Disposition: A | Discharge status: Home / Self Care | Payer: BC Managed Care – PPO | Source: Ambulatory Visit | Attending: Family Medicine | Admitting: Family Medicine

## 2023-05-20 DIAGNOSIS — Z1231 Encounter for screening mammogram for malignant neoplasm of breast: Secondary | ICD-10-CM | POA: Insufficient documentation

## 2023-05-22 ENCOUNTER — Other Ambulatory Visit: Payer: Self-pay | Admitting: Family Medicine

## 2023-05-22 DIAGNOSIS — R928 Other abnormal and inconclusive findings on diagnostic imaging of breast: Secondary | ICD-10-CM

## 2023-05-26 ENCOUNTER — Ambulatory Visit
Admission: RE | Admit: 2023-05-26 | Discharge: 2023-05-26 | Disposition: A | Discharge status: Home / Self Care | Payer: BC Managed Care – PPO | Source: Ambulatory Visit | Attending: Family Medicine | Admitting: Family Medicine

## 2023-05-26 ENCOUNTER — Ambulatory Visit
Admission: RE | Admit: 2023-05-26 | Discharge: 2023-05-26 | Disposition: A | Discharge status: Home / Self Care | Payer: BC Managed Care – PPO | Source: Ambulatory Visit | Attending: Family Medicine

## 2023-05-26 DIAGNOSIS — R928 Other abnormal and inconclusive findings on diagnostic imaging of breast: Secondary | ICD-10-CM | POA: Diagnosis present

## 2023-05-27 ENCOUNTER — Ambulatory Visit (INDEPENDENT_AMBULATORY_CARE_PROVIDER_SITE_OTHER): Payer: BC Managed Care – PPO | Admitting: Dermatology

## 2023-05-27 ENCOUNTER — Encounter: Payer: Self-pay | Admitting: Dermatology

## 2023-05-27 DIAGNOSIS — Z86018 Personal history of other benign neoplasm: Secondary | ICD-10-CM

## 2023-05-27 DIAGNOSIS — L814 Other melanin hyperpigmentation: Secondary | ICD-10-CM | POA: Diagnosis not present

## 2023-05-27 DIAGNOSIS — Z7189 Other specified counseling: Secondary | ICD-10-CM

## 2023-05-27 DIAGNOSIS — Z79899 Other long term (current) drug therapy: Secondary | ICD-10-CM

## 2023-05-27 DIAGNOSIS — L578 Other skin changes due to chronic exposure to nonionizing radiation: Secondary | ICD-10-CM | POA: Diagnosis not present

## 2023-05-27 DIAGNOSIS — D229 Melanocytic nevi, unspecified: Secondary | ICD-10-CM | POA: Diagnosis not present

## 2023-05-27 DIAGNOSIS — Z85820 Personal history of malignant melanoma of skin: Secondary | ICD-10-CM

## 2023-05-27 DIAGNOSIS — Z1283 Encounter for screening for malignant neoplasm of skin: Secondary | ICD-10-CM | POA: Diagnosis not present

## 2023-05-27 NOTE — Progress Notes (Signed)
Follow-Up Visit   Subjective  Latasha Cannon is a 42 y.o. female who presents for the following: Skin Cancer Screening and Full Body Skin Exam  The patient presents for Total-Body Skin Exam (TBSE) for skin cancer screening and mole check. The patient has spots, moles and lesions to be evaluated, some may be new or changing and the patient may have concern these could be cancer.  Patient with hx of MMis, dysplastic nevi. Patient does have some tingling with itch at left shoulder blade but nothing visible.   The following portions of the chart were reviewed this encounter and updated as appropriate: medications, allergies, medical history  Review of Systems:  No other skin or systemic complaints except as noted in HPI or Assessment and Plan.  Objective  Well appearing patient in no apparent distress; mood and affect are within normal limits.  A full examination was performed including scalp, head, eyes, ears, nose, lips, neck, chest, axillae, abdomen, back, buttocks, bilateral upper extremities, bilateral lower extremities, hands, feet, fingers, toes, fingernails, and toenails. All findings within normal limits unless otherwise noted below.   Relevant physical exam findings are noted in the Assessment and Plan.    Assessment & Plan   SKIN CANCER SCREENING PERFORMED TODAY.  ACTINIC DAMAGE - Chronic condition, secondary to cumulative UV/sun exposure - diffuse scaly erythematous macules with underlying dyspigmentation - Recommend daily broad spectrum sunscreen SPF 30+ to sun-exposed areas, reapply every 2 hours as needed.  - Staying in the shade or wearing long sleeves, sun glasses (UVA+UVB protection) and wide brim hats (4-inch brim around the entire circumference of the hat) are also recommended for sun protection.  - Call for new or changing lesions.  LENTIGINES, SEBORRHEIC KERATOSES, HEMANGIOMAS - Benign normal skin lesions - Benign-appearing - Call for any changes  MELANOCYTIC  NEVI - Tan-brown and/or pink-flesh-colored symmetric macules and papules - Benign appearing on exam today - Observation - Call clinic for new or changing moles - Recommend daily use of broad spectrum spf 30+ sunscreen to sun-exposed areas.  - 0.3 cm at right buttocks     History of Dysplastic Nevi Multiple see history - No evidence of recurrence today - Recommend regular full body skin exams - Recommend daily broad spectrum sunscreen SPF 30+ to sun-exposed areas, reapply every 2 hours as needed.  - Call if any new or changing lesions are noted between office visits    History of Melanoma in Situ Right prox dorsum med great toe 10/16/2020 Right post lat base of neck  01/28/2022 - No evidence of recurrence today - Clear to visual exam and palpation - No lymphadenopathy - Recommend regular full body skin exams - Recommend daily broad spectrum sunscreen SPF 30+ to sun-exposed areas, reapply every 2 hours as needed.  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 Chronic rubbing or scratching causes darkening of the skin.  OTC treatments which can help with itch include numbing creams like pramoxine or lidocaine which temporarily reduce itch or Capsaicin-containing creams which cause a burning sensation but which sometimes over time will reset the nerves to stop producing itch.  If you choose to use Capsaicin cream, it is recommended to use it 5 times daily for 1 week followed by 3 times daily for 3-6 weeks. You may  have to continue using it long-term.  If not doing well with OTC options, could consider [facility] compounded prescription anti-itch cream with Amitriptyline 5% / Lidocaine 5% / Pramoxine 1% or Amitriptyline 5% / Gabapentin 10% / Lidocaine 5% Cream  or other prescription cream or pill options.     Return in about 4 months (around 09/24/2023) for TBSE, with Dr. Kirtland Bouchard, Hx MMis, Hx Dysplastic Nevi.  [name], RMA, am acting as scribe for Armida Sans, MD .   Documentation: I have reviewed the above documentation for accuracy and completeness, and I agree with the above.  Armida Sans, MD

## 2023-05-27 NOTE — Patient Instructions (Addendum)
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 This is usually accompanied by chronic rubbing or scratching often leaving the area of skin discolored and thickened. There is no cure, but there are some treatments which may help control the itch.   Over the counter (non-prescription) treatments for notalgia paresthetica include numbing creams like pramoxine or lidocaine which temporarily reduce itch or Capsaicin-containing creams which cause a burning sensation but which sometimes over time will reset the nerves to stop producing itch.  If you choose to use Capsaicin cream, it is recommended to use it 5 times daily for 1 week followed by 3 times daily for 3-6 weeks. You may have to continue using it long-term. - If not doing well with OTC options, could consider [employer] [facility] compounded prescription anti-itch cream with Amitriptyline 5% / Lidocaine 5% / Pramoxine 1% or Amitriptyline 5% / Gabapentin 10% / Lidocaine 5% Cream. For severe cases, there are some prescription cream or pill options which may help. Other treatment options include: - Transcutaneous Electrical Nerve Stimulation (TENS) - Gabapentin 300-900 mg daily po - Amitriptyline orally - Paravertebral local anesthetic block - intralesional Botulinum toxin A   Melanoma ABCDEs  Melanoma is the most dangerous type of skin cancer, and is the leading cause of death from skin disease.  You are more likely to develop melanoma if you: Have light-colored skin, light-colored eyes, or red or blond hair Spend a lot of time in the sun Tan regularly, either outdoors or in a tanning bed Have had blistering sunburns, especially during childhood Have a close family member who has had a melanoma Have atypical moles or large birthmarks  Early detection of melanoma is key since treatment is typically straightforward and cure  rates are extremely high if we catch it early.   The first sign of melanoma is often a change in a mole or a new dark spot.  The ABCDE system is a way of remembering the signs of melanoma.  A for asymmetry:  The two halves do not match. B for border:  The edges of the growth are irregular. C for color:  A mixture of colors are present instead of an even brown color. D for diameter:  Melanomas are usually (but not always) greater than 6mm - the size of a pencil eraser. E for evolution:  The spot keeps changing in size, shape, and color.  Please check your skin once per month between visits. You can use a small mirror in front and a large mirror behind you to keep an eye on the back side or your body.   If you see any new or changing lesions before your next follow-up, please call to schedule a visit.  Please continue daily skin protection including broad spectrum sunscreen SPF 30+ to sun-exposed areas, reapplying every 2 hours as needed when you're outdoors.    Due to recent changes in healthcare laws, you may see results of your pathology and/or laboratory studies on MyChart before the doctors have had a chance to review them. We understand that in some cases there may be results that are confusing or concerning to you. Please understand that not all results are received at the same time and often the doctors may need to interpret multiple results in order to provide you with the best plan of care or course of treatment. Therefore, we ask that  you please give [place] 2 business days to thoroughly review all your results before contacting the office for clarification. Should we see a critical lab result, you will be contacted sooner.   If You Need Anything After Your Visit  If you have any questions or concerns for your doctor, please call our main line at [phone] and press option 4 to reach your doctor's medical assistant. If no one answers, please leave a voicemail as directed and we will  return your call as soon as possible. Messages left after 4 pm will be answered the following business day.   You may also send [place] a message via MyChart. We typically respond to MyChart messages within 1-2 business days.  For prescription refills, please ask your pharmacy to contact our office. Our fax number is [phone].  If you have an urgent issue when the clinic is closed that cannot wait until the next business day, you can page your doctor at the number below.    Please note that while we do our best to be available for urgent issues outside of office hours, we are not available 24/7.   If you have an urgent issue and are unable to reach [place], you may choose to seek medical care at your doctor's office, retail clinic, urgent care center, or emergency room.  If you have a medical emergency, please immediately call 911 or go to the emergency department.  Pager Numbers  - Dr. Gwen Pounds: [phone]  - Dr. Roseanne Reno: [phone]  - Dr. Katrinka Blazing: [phone]   In the event of inclement weather, please call our main line at [phone] for an update on the status of any delays or closures.  Dermatology Medication Tips: Please keep the boxes that topical medications come in in order to help keep track of the instructions about where and how to use these. Pharmacies typically print the medication instructions only on the boxes and not directly on the medication tubes.   If your medication is too expensive, please contact our office at [phone] option 4 or send [place] a message through MyChart.   We are unable to tell what your co-pay for medications will be in advance as this is different depending on your insurance coverage. However, we may be able to find a substitute medication at lower cost or fill out paperwork to get insurance to cover a needed medication.   If a prior authorization is required to get your medication covered by your insurance company, please allow [place] 1-2 business  days to complete this process.  Drug prices often vary depending on where the prescription is filled and some pharmacies may offer cheaper prices.  The website www.goodrx.com contains coupons for medications through different pharmacies. The prices here do not account for what the cost may be with help from insurance (it may be cheaper with your insurance), but the website can give you the price if you did not use any insurance.  - You can print the associated coupon and take it with your prescription to the pharmacy.  - You may also stop by our office during regular business hours and pick up a GoodRx coupon card.  - If you need your prescription sent electronically to a different pharmacy, notify our office through [employer] or by phone at [phone] option 4.

## 2023-05-28 ENCOUNTER — Encounter: Payer: Self-pay | Admitting: Family Medicine

## 2023-06-01 ENCOUNTER — Other Ambulatory Visit: Payer: BC Managed Care – PPO

## 2023-07-10 ENCOUNTER — Telehealth (INDEPENDENT_AMBULATORY_CARE_PROVIDER_SITE_OTHER): Payer: BC Managed Care – PPO | Admitting: Family Medicine

## 2023-07-10 ENCOUNTER — Encounter: Payer: Self-pay | Admitting: Family Medicine

## 2023-07-10 DIAGNOSIS — J069 Acute upper respiratory infection, unspecified: Secondary | ICD-10-CM

## 2023-07-10 MED ORDER — BENZONATATE 100 MG PO CAPS: 100.0000 mg | ORAL_CAPSULE | Freq: Two times a day (BID) | ORAL | 0 refills | Status: DC | PRN

## 2023-07-10 NOTE — Progress Notes (Signed)
MyChart Video Visit    Virtual Visit via Video Note   This format is felt to be most appropriate for this patient at this time. Physical exam was limited by quality of the video and audio technology used for the visit.    Patient location: home Provider location: [facility] Persons involved in the visit: patient, provider  I discussed the limitations of evaluation and management by telemedicine and the availability of in person appointments. The patient expressed understanding and agreed to proceed.  Patient: Latasha Cannon   DOB: [birth year]   42 y.o. Female  MRN: [id number] Visit Date: 07/10/2023  Today's healthcare provider: Shirlee Latch, MD   No chief complaint on file.  Subjective    HPI   Discussed the use of AI scribe software for clinical note transcription with the patient, who gave verbal consent to proceed.  History of Present Illness   The patient presents with a productive cough and congestion that started on Tuesday. She describes the mucus as thick and yellowish, greenish, brownish in color. The patient reports that the congestion is localized in the head and neck, with no chest involvement. Despite feeling feverish at times, she has not recorded a temperature. Prior to the onset of the cough, the patient experienced nasal congestion for about a week, which she initially attributed to allergies. The patient's husband and baby are also currently ill, leading her to suspect a viral cause. The patient's throat is sore, possibly due to the coughing. She has been managing her symptoms with a cold medicine containing a decongestant and Flonase. The patient reports no pain or pressure in the sinuses, but does feel some pressure in the ears.       Review of Systems      Objective    There were no vitals taken for this visit.      Physical Exam Constitutional:      General: She is not in acute distress.    Appearance: Normal appearance.   HENT:     Head: Normocephalic.  Pulmonary:     Effort: Pulmonary effort is normal. No respiratory distress.  Neurological:     Mental Status: She is alert and oriented to person, place, and time. Mental status is at baseline.        Assessment & Plan     Problem List Items Addressed This Visit   None Visit Diagnoses       Viral URI with cough    -  Primary           Upper Respiratory Tract Infection (URTI) Presents with a productive cough, thick yellow-green-brown mucus, sore throat, and ear pressure since Tuesday. No fever, chest involvement, or sinus pain. Likely viral etiology, given similar symptoms in family members. Typical duration is 7-10 days. Currently using cold medicine with decongestant and Flonase. Discussed honey as an effective cough remedy. Advised to monitor for consistent fever, shortness of breath, and a hacking productive cough, which may indicate secondary pneumonia. - Recommend Mucinex D for decongestion - Prescribe Tessalon pearls for cough - Advise using honey for cough relief - Continue Flonase once daily - Monitor for consistent fever, shortness of breath, or a hacking productive cough  Menstrual Irregularities on Continuous Birth Control Reports heavy periods despite three months of continuous birth control use. No side effects from the birth control. Effective as contraception despite ongoing periods. - Continue current birth control regimen  General Health Maintenance Up to date with Pap smear.  Practicing good hand hygiene and preventive measures. - Continue good hand hygiene and avoid coughing on others        Meds ordered this encounter  Medications   benzonatate (TESSALON) 100 MG capsule    Sig: Take 1 capsule (100 mg total) by mouth 2 (two) times daily as needed for cough.    Dispense:  20 capsule    Refill:  0     Return if symptoms worsen or fail to improve.     I discussed the assessment and treatment plan with the patient.  The patient was provided an opportunity to ask questions and all were answered. The patient agreed with the plan and demonstrated an understanding of the instructions.   The patient was advised to call back or seek an in-person evaluation if the symptoms worsen or if the condition fails to improve as anticipated.   Shirlee Latch, MD [facility] [phone] (phone) [phone] (fax)  [facility]

## 2023-07-19 ENCOUNTER — Encounter: Payer: Self-pay | Admitting: Family Medicine

## 2023-07-20 ENCOUNTER — Ambulatory Visit: Payer: Self-pay

## 2023-07-20 MED ORDER — DOXYCYCLINE HYCLATE 100 MG PO TABS: 100.0000 mg | ORAL_TABLET | Freq: Two times a day (BID) | ORAL | 0 refills | Status: AC

## 2023-07-20 NOTE — Telephone Encounter (Signed)
Noted see my chart encounter

## 2023-07-20 NOTE — Telephone Encounter (Signed)
Message from [place] [facility] sent at 07/20/2023  9:22 AM EST  Summary: Possible Ear Infection   Pt is calling in because she saw Dr. B virtually on 07/10/23 and says she is beginning to experience pain in the ear. Pt says she is having trouble hearing out of them and would like advice as to if she needs an antibiotic or what she can do. Pt says she believes the ear infection is related to the same illness she saw Dr. B about.         Chief Complaint: left ear ache Symptoms: muffled hearing Frequency: last week  Pertinent Negatives: Patient denies discharge Disposition: [] ED /[] Urgent Care (no appt availability in office) / [] Appointment(In office/virtual)/ []  [place] [facility]/ [] Home Care/ [] Refused Recommended Disposition /[] [place] [facility]/ [x]  Follow-up with PCP Additional Notes: pt is not breast feeding. Refused appt with Erin Mecum PA- No appts available in office.    Reason for Disposition . Earache  (Exceptions: brief ear pain of < 60 minutes duration, earache occurring during air travel  Answer Assessment - Initial Assessment Questions 1. LOCATION: "Which ear is involved?"     left 2. ONSET: "When did the ear start hurting"      Last week 3. SEVERITY: "How bad is the pain?"  (Scale 1-10; mild, moderate or severe)   - MILD (1-3): doesn't interfere with normal activities    - MODERATE (4-7): interferes with normal activities or awakens from sleep    - SEVERE (8-10): excruciating pain, unable to do any normal activities      mild 4. URI SYMPTOMS: "Do you have a runny nose or cough?"     yes 5. FEVER: "Do you have a fever?" If Yes, ask: "What is your temperature, how was it measured, and when did it start?"     no  7. OTHER SYMPTOMS: "Do you have any other symptoms?" (e.g., headache, stiff neck, dizziness, vomiting, runny nose, decreased hearing)     Muffled hearing 8. PREGNANCY: "Is there any chance you are pregnant?" "When was your last menstrual period?"     No- PT  IS NOT BREAST FEEDING  Protocols used: Davina Poke

## 2023-09-30 ENCOUNTER — Encounter: Payer: Self-pay | Admitting: Dermatology

## 2023-09-30 ENCOUNTER — Ambulatory Visit: Payer: BC Managed Care – PPO | Admitting: Dermatology

## 2023-09-30 DIAGNOSIS — Z85820 Personal history of malignant melanoma of skin: Secondary | ICD-10-CM

## 2023-09-30 DIAGNOSIS — Z86018 Personal history of other benign neoplasm: Secondary | ICD-10-CM

## 2023-09-30 DIAGNOSIS — W908XXA Exposure to other nonionizing radiation, initial encounter: Secondary | ICD-10-CM

## 2023-09-30 DIAGNOSIS — L814 Other melanin hyperpigmentation: Secondary | ICD-10-CM

## 2023-09-30 DIAGNOSIS — L821 Other seborrheic keratosis: Secondary | ICD-10-CM

## 2023-09-30 DIAGNOSIS — Z1283 Encounter for screening for malignant neoplasm of skin: Secondary | ICD-10-CM | POA: Diagnosis not present

## 2023-09-30 DIAGNOSIS — L578 Other skin changes due to chronic exposure to nonionizing radiation: Secondary | ICD-10-CM

## 2023-09-30 DIAGNOSIS — D225 Melanocytic nevi of trunk: Secondary | ICD-10-CM

## 2023-09-30 DIAGNOSIS — Z86006 Personal history of melanoma in-situ: Secondary | ICD-10-CM

## 2023-09-30 DIAGNOSIS — L820 Inflamed seborrheic keratosis: Secondary | ICD-10-CM

## 2023-09-30 DIAGNOSIS — D229 Melanocytic nevi, unspecified: Secondary | ICD-10-CM

## 2023-09-30 DIAGNOSIS — D2361 Other benign neoplasm of skin of right upper limb, including shoulder: Secondary | ICD-10-CM

## 2023-09-30 DIAGNOSIS — L858 Other specified epidermal thickening: Secondary | ICD-10-CM

## 2023-09-30 NOTE — Progress Notes (Signed)
 Follow-Up Visit   Subjective  Latasha Cannon is a 43 y.o. female who presents for the following: Skin Cancer Screening and Full Body Skin Exam Hx of mmis, hx of dysplastic nevi, hx of notalgia paraesthetica. Patient reports a spot she noticed 2 weeks ago on her right wrist.  The patient presents for Total-Body Skin Exam (TBSE) for skin cancer screening and mole check. The patient has spots, moles and lesions to be evaluated, some may be new or changing and the patient may have concern these could be cancer.  The following portions of the chart were reviewed this encounter and updated as appropriate: medications, allergies, medical history  Review of Systems:  No other skin or systemic complaints except as noted in HPI or Assessment and Plan.  Objective  Well appearing patient in no apparent distress; mood and affect are within normal limits.  A full examination was performed including scalp, head, eyes, ears, nose, lips, neck, chest, axillae, abdomen, back, buttocks, bilateral upper extremities, bilateral lower extremities, hands, feet, fingers, toes, fingernails, and toenails. All findings within normal limits unless otherwise noted below.   Relevant physical exam findings are noted in the Assessment and Plan.  right wrist x 1 Erythematous stuck-on, waxy papule or plaque  Assessment & Plan   SKIN CANCER SCREENING PERFORMED TODAY.  ACTINIC DAMAGE - Chronic condition, secondary to cumulative UV/sun exposure - diffuse scaly erythematous macules with underlying dyspigmentation - Recommend daily broad spectrum sunscreen SPF 30+ to sun-exposed areas, reapply every 2 hours as needed.  - Staying in the shade or wearing long sleeves, sun glasses (UVA+UVB protection) and wide brim hats (4-inch brim around the entire circumference of the hat) are also recommended for sun protection.  - Call for new or changing lesions.  LENTIGINES, SEBORRHEIC KERATOSES, HEMANGIOMAS - Benign normal skin  lesions - Benign-appearing - Call for any changes  MELANOCYTIC NEVI - Tan-brown and/or pink-flesh-colored symmetric macules and papules - Benign appearing on exam today - Observation - Call clinic for new or changing moles - Recommend daily use of broad spectrum spf 30+ sunscreen to sun-exposed areas.   Nevus   Exam: 0.3 cm at right buttocks  See previous photo found in media date 05/27/2023 Benign-appearing. Stable compared to previous visit. Observation.  Call clinic for new or changing moles.  Recommend daily use of broad spectrum spf 30+ sunscreen to sun-exposed areas.   Nevus vs dyschromia from previous trauma? - Now resolved. left middle medial sole of foot See previous photo Exam: Resolved today at exam  Clear. Observe for recurrence. Call clinic for new or changing lesions.  Recommend regular skin exams, daily broad-spectrum spf 30+ sunscreen use, and photoprotection.     DERMATOFIBROMA Right mid dorsum forearm Exam: 4 mm Firm pink/brown papulenodule with dimple sign. Treatment Plan: A dermatofibroma is a benign growth possibly related to trauma, such as an insect bite, cut from shaving, or inflamed acne-type bump.  Treatment options to remove include shave or excision with resulting scar and risk of recurrence.  Since benign-appearing and not bothersome, will observe for now.    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 Genetic in nature. No cure. - Observe. - If desired, patient can use an emollient (moisturizer) containing ammonium lactate (AmLactin), urea or salicylic acid once a day to smooth the area  Recommend starting moisturizer with exfoliant (Urea, Salicylic acid, or Lactic acid) one to two times daily to help smooth rough and bumpy skin.  OTC  options include Cetaphil Rough and Bumpy lotion (Urea), Eucerin Roughness Relief lotion or spot treatment cream (Urea), CeraVe SA lotion/cream for Rough and Bumpy skin (Sal Acid), Gold  Bond Rough and Bumpy cream (Sal Acid), and AmLactin 12% lotion/cream (Lactic Acid).  If applying in morning, also apply sunscreen to sun-exposed areas, since these exfoliating moisturizers can increase sensitivity to sun.   HISTORY OF MELANOMA INSITU  Right prox dorsum med great toe 10/16/2020 Right post lat base of neck  01/28/2022 - No evidence of recurrence today - No lymphadenopathy - Recommend regular full body skin exams - Recommend daily broad spectrum sunscreen SPF 30+ to sun-exposed areas, reapply every 2 hours as needed.  - Call if any new or changing lesions are noted between office visits  HISTORY OF DYSPLASTIC NEVUS Left Superior Calf - mild - 05/08/2021 Left dorsum middle toe medial aspect - moderate to severe - 03/21/2015 No evidence of recurrence today Recommend regular full body skin exams Recommend daily broad spectrum sunscreen SPF 30+ to sun-exposed areas, reapply every 2 hours as needed.  Call if any new or changing lesions are noted between office visits  INFLAMED SEBORRHEIC KERATOSIS right wrist x 1 Symptomatic, irritating, patient would like treated. Destruction of lesion - right wrist x 1 Complexity: simple   Destruction method: cryotherapy   Informed consent: discussed and consent obtained   Timeout:  patient name, date of birth, surgical site, and procedure verified Lesion destroyed using liquid nitrogen: Yes   Region frozen until ice ball extended beyond lesion: Yes   Outcome: patient tolerated procedure well with no complications   Post-procedure details: wound care instructions given   Return for 4 month tbse .  IAsher Muir, CMA, am acting as scribe for Armida Sans, MD.   Documentation: I have reviewed the above documentation for accuracy and completeness, and I agree with the above.  Armida Sans, MD

## 2023-09-30 NOTE — Patient Instructions (Addendum)

## 2023-12-08 ENCOUNTER — Ambulatory Visit: Admitting: Family Medicine

## 2023-12-08 ENCOUNTER — Encounter: Payer: Self-pay | Admitting: Family Medicine

## 2023-12-08 VITALS — BP 114/64 | HR 89 | Ht 63.0 in | Wt 137.0 lb

## 2023-12-08 DIAGNOSIS — J029 Acute pharyngitis, unspecified: Secondary | ICD-10-CM | POA: Diagnosis not present

## 2023-12-08 DIAGNOSIS — H9203 Otalgia, bilateral: Secondary | ICD-10-CM | POA: Diagnosis not present

## 2023-12-08 LAB — POCT RAPID STREP A (OFFICE): Rapid Strep A Screen: NEGATIVE

## 2023-12-08 MED ORDER — DOXYCYCLINE HYCLATE 100 MG PO TABS: 100.0000 mg | ORAL_TABLET | Freq: Two times a day (BID) | ORAL | 0 refills | Status: AC

## 2023-12-08 NOTE — Progress Notes (Signed)
 Acute Office Visit  Subjective:     Patient ID: Latasha Cannon, female    DOB: Oct 15, 1980, 43 y.o.   MRN: [id number]  Chief Complaint  Patient presents with   Ear Pain    2 weeks ago she had pain in both ears, now its more so the L (throat looks a little red) she did state she had a sore throat the 1st day but it went away    PCP: Mazie Speed, MD  Discussed the use of AI scribe software for clinical note transcription with the patient, who gave verbal consent to proceed.  History of Present Illness Latasha Cannon is a 43 year old female who presents with ear pain for two weeks.  She has been experiencing ear pain for the past two weeks, initially affecting both ears but now more pronounced on the left side. The pain is severe, particularly worsening later in the day and into the night. Notably, the pain was absent on Saturday but recurred on Sunday after exposure to cold air.  Initially, she experienced a sore throat on the first day, which resolved after a few days. She has been using decongestants, a heat pad, and Tylenol , which have provided some relief, but the symptoms persist. She has been taking Flonase  and has not used amoxicillin or prednisone recently.  She recalls having similar ear pain over Christmas, for which she was prescribed antibiotics by another doctor. She is surprised at experiencing ear infections as an adult, noting that her 5-month-old baby recovers quickly from similar symptoms.  Her 33-month-old baby attends daycare and had nasal congestion the week before her symptoms began, suggesting a possible source of her illness.       Objective:    BP 114/64   Pulse 89   Ht 5\' 3"  (1.6 m)   Wt 137 lb (62.1 kg)   SpO2 99%   BMI 24.27 kg/m  BP Readings from Last 3 Encounters:  12/08/23 114/64  05/07/23 105/71  01/28/23 121/81   Wt Readings from Last 3 Encounters:  12/08/23 137 lb (62.1 kg)  05/07/23 145 lb 8 oz (66 kg)  10/03/22 176 lb 9.4 oz  (80.1 kg)      Physical Exam Physical Exam HEENT: Ear canal skin healthy, tympanic membranes bulging. Tympanic membrane effusions, erythematous oropharynx, no palpable LAD    Results for orders placed or performed in visit on 12/08/23  POCT rapid strep A  Result Value Ref Range   Rapid Strep A Screen Negative Negative        Assessment & Plan:   Problem List Items Addressed This Visit   None Visit Diagnoses       Otalgia of both ears    -  Primary   Relevant Medications   doxycycline  (VIBRA -TABS) 100 MG tablet     Pharyngitis, unspecified etiology       Relevant Medications   doxycycline  (VIBRA -TABS) 100 MG tablet   Other Relevant Orders   POCT rapid strep A (Completed)   Culture, Group A Strep       Assessment & Plan Bilateral otitis media with effusion Bilateral ear pain for two weeks, more severe on the left side, with effusion in both ears. Pain exacerbates later in the day and at night. Similar symptoms treated with antibiotics over Christmas. Likely related to recent upper respiratory infection from daycare exposure. Doxycycline  initiated to address ear and throat symptoms without triggering allergies. - Prescribe doxycycline  100 mg twice daily.  Pharyngitis  Initial sore throat resolved, but persistent drainage. Throat was erythematous on the left side last week. Strep swab and culture ordered to rule out streptococcal infection. Treatment initiated due to persistent symptoms and exposure history. - Perform strep swab and culture. - Prescribe doxycycline  100 mg twice daily.    Meds ordered this encounter  Medications   doxycycline  (VIBRA -TABS) 100 MG tablet    Sig: Take 1 tablet (100 mg total) by mouth 2 (two) times daily for 10 days.    Dispense:  20 tablet    Refill:  0    Return if symptoms worsen or fail to improve.  Mimi Alt, MD

## 2023-12-09 ENCOUNTER — Encounter: Payer: Self-pay | Admitting: Family Medicine

## 2023-12-10 NOTE — Telephone Encounter (Signed)
 Follow up question from your visit

## 2023-12-11 ENCOUNTER — Ambulatory Visit: Payer: Self-pay | Admitting: Family Medicine

## 2023-12-11 LAB — CULTURE, GROUP A STREP: Strep A Culture: NEGATIVE

## 2024-01-06 ENCOUNTER — Telehealth: Payer: Self-pay

## 2024-01-06 NOTE — Telephone Encounter (Signed)
 Copied from CRM [id number]. Topic: Appointments - Appointment Scheduling >> Jan 06, 2024  4:01 PM Chrystal Crape R wrote: P{t would like to sch a appointment to have her right ear drained as she's having some popping and drainage in the right ear along with nasal and throat congestion.

## 2024-01-07 NOTE — Telephone Encounter (Signed)
 Called patient and set up acute visit with downstairs 01/08/24

## 2024-01-08 ENCOUNTER — Ambulatory Visit: Admitting: Internal Medicine

## 2024-01-08 ENCOUNTER — Encounter: Payer: Self-pay | Admitting: Internal Medicine

## 2024-01-08 ENCOUNTER — Other Ambulatory Visit: Payer: Self-pay

## 2024-01-08 VITALS — BP 120/80 | HR 88 | Temp 98.1°F | Resp 16 | Ht 63.0 in | Wt 137.0 lb

## 2024-01-08 DIAGNOSIS — H6991 Unspecified Eustachian tube disorder, right ear: Secondary | ICD-10-CM

## 2024-01-08 DIAGNOSIS — J302 Other seasonal allergic rhinitis: Secondary | ICD-10-CM | POA: Diagnosis not present

## 2024-01-08 NOTE — Patient Instructions (Signed)
 Eustachian Tube Dysfunction  Eustachian tube dysfunction refers to a condition in which a blockage develops in the narrow passage that connects the middle ear to the back of the nose (eustachian tube). The eustachian tube regulates air pressure in the middle ear by letting air move between the ear and nose. It also helps to drain fluid from the middle ear space. Eustachian tube dysfunction can affect one or both ears. When the eustachian tube does not function properly, air pressure, fluid, or both can build up in the middle ear. What are the causes? This condition occurs when the eustachian tube becomes blocked or cannot open normally. Common causes of this condition include: Ear infections. Colds and other infections that affect the nose, mouth, and throat (upper respiratory tract). Allergies. Irritation from cigarette smoke. Irritation from stomach acid coming up into the esophagus (gastroesophageal reflux). The esophagus is the part of the body that moves food from the mouth to the stomach. Sudden changes in air pressure, such as from descending in an airplane or scuba diving. Abnormal growths in the nose or throat, such as: Growths that line the nose (nasal polyps). Abnormal growth of cells (tumors). Enlarged tissue at the back of the throat (adenoids). What increases the risk? You are more likely to develop this condition if: You smoke. You are overweight. You are a child who has: Certain birth defects of the mouth, such as cleft palate. Large tonsils or adenoids. What are the signs or symptoms? Common symptoms of this condition include: A feeling of fullness in the ear. Ear pain. Clicking or popping noises in the ear. Ringing in the ear (tinnitus). Hearing loss. Loss of balance. Dizziness. Symptoms may get worse when the air pressure around you changes, such as when you travel to an area of high elevation, fly on an airplane, or go scuba diving. How is this diagnosed? This  condition may be diagnosed based on: Your symptoms. A physical exam of your ears, nose, and throat. Tests, such as those that measure: The movement of your eardrum. Your hearing (audiometry). How is this treated? Treatment depends on the cause and severity of your condition. In mild cases, you may relieve your symptoms by moving air into your ears. This is called "popping the ears." In more severe cases, or if you have symptoms of fluid in your ears, treatment may include: Medicines to relieve congestion (decongestants). Medicines that treat allergies (antihistamines). Nasal sprays or ear drops that contain medicines that reduce swelling (steroids). A procedure to drain the fluid in your eardrum. In this procedure, a small tube may be placed in the eardrum to: Drain the fluid. Restore the air in the middle ear space. A procedure to insert a balloon device through the nose to inflate the opening of the eustachian tube (balloon dilation). Follow these instructions at home: Lifestyle Do not do any of the following until your health care provider approves: Travel to high altitudes. Fly in airplanes. Work in a Estate agent or room. Scuba dive. Do not use any products that contain nicotine or tobacco. These products include cigarettes, chewing tobacco, and vaping devices, such as e-cigarettes. If you need help quitting, ask your health care provider. Keep your ears dry. Wear fitted earplugs during showering and bathing. Dry your ears completely after. General instructions Take over-the-counter and prescription medicines only as told by your health care provider. Use techniques to help pop your ears as recommended by your health care provider. These may include: Chewing gum. Yawning. Frequent, forceful swallowing.  Closing your mouth, holding your nose closed, and gently blowing as if you are trying to blow air out of your nose. Keep all follow-up visits. This is important. Contact a  health care provider if: Your symptoms do not go away after treatment. Your symptoms come back after treatment. You are unable to pop your ears. You have: A fever. Pain in your ear. Pain in your head or neck. Fluid draining from your ear. Your hearing suddenly changes. You become very dizzy. You lose your balance. Get help right away if: You have a sudden, severe increase in any of your symptoms. Summary Eustachian tube dysfunction refers to a condition in which a blockage develops in the eustachian tube. It can be caused by ear infections, allergies, inhaled irritants, or abnormal growths in the nose or throat. Symptoms may include ear pain or fullness, hearing loss, or ringing in the ears. Mild cases are treated with techniques to unblock the ears, such as yawning or chewing gum. More severe cases are treated with medicines or procedures. This information is not intended to replace advice given to you by your health care provider. Make sure you discuss any questions you have with your health care provider. Document Revised: 09/17/2020 Document Reviewed: 09/17/2020 Elsevier Patient Education  2024 [employer].

## 2024-01-08 NOTE — Progress Notes (Signed)
 Acute Office Visit  Subjective:     Patient ID: Latasha Cannon, female    DOB: Mar 19, 1981, 43 y.o.   MRN: [id number]  Chief Complaint  Patient presents with   Ear Fullness    Right ear worse than left for 2 weeks, been on antibiotics need recheck    Ear Fullness  Pertinent negatives include no sore throat.   Patient is in today for follow up on ear pain. She is a [facility] patient and this is my first time meeting her.   Discussed the use of AI scribe software for clinical note transcription with the patient, who gave verbal consent to proceed.  History of Present Illness  Latasha Cannon is a 43 year old female who presents with persistent ear issues and nasal congestion following a recent ear infection.  In May, she experienced severe ear pain diagnosed as an ear infection with fluid in both ears, treated with doxycycline  for ten days, resolving the severe ear pain. Since stopping the antibiotic, she has intermittent ear symptoms, including popping, crackling, and a sensation of fluid presence without pain. She also has thick nasal and throat drainage, described as clear mucus, and hears 'rattling'.  She uses Flonase , loratadine, and a decongestant, which have slightly reduced congestion but not completely resolved it. No facial pain, severe ear pain, or fever. Occasional ear pressure and blurry vision are noted.  She has a history of a severe skin reaction to prednisone, described as a 'hot red' rash similar to sunburn, and is concerned about taking similar medications.  Review of Systems  Constitutional:  Negative for chills and fever.  HENT:  Positive for congestion and sinus pain. Negative for ear pain, sore throat and tinnitus.   Respiratory:  Negative for shortness of breath.   Cardiovascular:  Negative for chest pain.        Objective:    BP 120/80 (Cuff Size: Large)   Pulse 88   Temp 98.1 F (36.7 C) (Oral)   Resp 16   Ht 5' 3 (1.6 m)   Wt 137 lb (62.1 kg)   SpO2  99%   BMI 24.27 kg/m  BP Readings from Last 3 Encounters:  01/08/24 120/80  12/08/23 114/64  05/07/23 105/71   Wt Readings from Last 3 Encounters:  01/08/24 137 lb (62.1 kg)  12/08/23 137 lb (62.1 kg)  05/07/23 145 lb 8 oz (66 kg)      Physical Exam Constitutional:      Appearance: Normal appearance.  HENT:     Head: Normocephalic and atraumatic.     Right Ear: Ear canal and external ear normal.     Left Ear: Tympanic membrane, ear canal and external ear normal.     Ears:     Comments: Right TM with fluid, no signs of infection    Nose: Nose normal.     Mouth/Throat:     Mouth: Mucous membranes are moist.     Comments: Mild PND  Eyes:     Conjunctiva/sclera: Conjunctivae normal.    Cardiovascular:     Rate and Rhythm: Normal rate and regular rhythm.  Pulmonary:     Effort: Pulmonary effort is normal.     Breath sounds: Normal breath sounds.   Skin:    General: Skin is warm and dry.   Neurological:     General: No focal deficit present.     Mental Status: She is alert. Mental status is at baseline.   Psychiatric:  Mood and Affect: Mood normal.        Behavior: Behavior normal.     No results found for any visits on 01/08/24.      Assessment & Plan:   Assessment & Plan   Eustachian Tube Dysfunction Persistent dysfunction post-otitis media with effusion, likely allergy or viral-related. Systemic steroids contraindicated due to severe reaction. - Continue Flonase  nasal spray, two sprays each nostril twice daily. - Use nasal saline rinse before Flonase . - Consider cetirizine if symptoms persist. - Avoid systemic steroids. - Return for evaluation if symptoms worsen.  Allergic Rhinitis Chronic rhinitis with persistent symptoms despite loratadine and Flonase . Potential viral exposure from daycare. - Continue loratadine or consider cetirizine. - Use nasal saline rinse to alleviate dryness. - Discontinue decongestant if ineffective. - Maintain  hygiene to reduce viral exposure.  General Health Maintenance Emphasized hygiene to prevent illness from daycare exposure. - Ensure vaccinations are up to date. - Practice good hand hygiene and sanitize surfaces.  Return if symptoms worsen or fail to improve.  Rockney Cid, DO

## 2024-02-03 ENCOUNTER — Encounter: Payer: Self-pay | Admitting: Family Medicine

## 2024-02-10 ENCOUNTER — Ambulatory Visit: Admitting: Dermatology

## 2024-02-10 ENCOUNTER — Encounter: Payer: Self-pay | Admitting: Dermatology

## 2024-02-10 DIAGNOSIS — Z86018 Personal history of other benign neoplasm: Secondary | ICD-10-CM

## 2024-02-10 DIAGNOSIS — R238 Other skin changes: Secondary | ICD-10-CM

## 2024-02-10 DIAGNOSIS — L858 Other specified epidermal thickening: Secondary | ICD-10-CM

## 2024-02-10 DIAGNOSIS — L578 Other skin changes due to chronic exposure to nonionizing radiation: Secondary | ICD-10-CM | POA: Diagnosis not present

## 2024-02-10 DIAGNOSIS — L814 Other melanin hyperpigmentation: Secondary | ICD-10-CM

## 2024-02-10 DIAGNOSIS — Z85820 Personal history of malignant melanoma of skin: Secondary | ICD-10-CM

## 2024-02-10 DIAGNOSIS — L720 Epidermal cyst: Secondary | ICD-10-CM

## 2024-02-10 DIAGNOSIS — Z86006 Personal history of melanoma in-situ: Secondary | ICD-10-CM

## 2024-02-10 DIAGNOSIS — Z1283 Encounter for screening for malignant neoplasm of skin: Secondary | ICD-10-CM

## 2024-02-10 DIAGNOSIS — D239 Other benign neoplasm of skin, unspecified: Secondary | ICD-10-CM

## 2024-02-10 DIAGNOSIS — D225 Melanocytic nevi of trunk: Secondary | ICD-10-CM

## 2024-02-10 DIAGNOSIS — D2361 Other benign neoplasm of skin of right upper limb, including shoulder: Secondary | ICD-10-CM

## 2024-02-10 DIAGNOSIS — L729 Follicular cyst of the skin and subcutaneous tissue, unspecified: Secondary | ICD-10-CM

## 2024-02-10 DIAGNOSIS — L821 Other seborrheic keratosis: Secondary | ICD-10-CM

## 2024-02-10 DIAGNOSIS — W908XXA Exposure to other nonionizing radiation, initial encounter: Secondary | ICD-10-CM | POA: Diagnosis not present

## 2024-02-10 DIAGNOSIS — Z872 Personal history of diseases of the skin and subcutaneous tissue: Secondary | ICD-10-CM

## 2024-02-10 DIAGNOSIS — D229 Melanocytic nevi, unspecified: Secondary | ICD-10-CM

## 2024-02-10 DIAGNOSIS — Z7189 Other specified counseling: Secondary | ICD-10-CM

## 2024-02-10 NOTE — Addendum Note (Signed)
 Addended by: TANDA SETTER A on: 02/10/2024 02:06 PM   Modules accepted: Orders

## 2024-02-10 NOTE — Progress Notes (Signed)
 Follow-Up Visit   Subjective  Latasha Cannon is a 43 y.o. female who presents for the following: Skin Cancer Screening and Full Body Skin Exam Hx of dysplastic nevi, hx of melanoma in situ, hx of aks, isks Patient reports a few new freckles she noticed and a small lump at scar of past surgery to remove thyroid  gland  The patient presents for Total-Body Skin Exam (TBSE) for skin cancer screening and mole check. The patient has spots, moles and lesions to be evaluated, some may be new or changing and the patient may have concern these could be cancer.  The following portions of the chart were reviewed this encounter and updated as appropriate: medications, allergies, medical history  Review of Systems:  No other skin or systemic complaints except as noted in HPI or Assessment and Plan.  Objective  Well appearing patient in no apparent distress; mood and affect are within normal limits.  A full examination was performed including scalp, head, eyes, ears, nose, lips, neck, chest, axillae, abdomen, back, buttocks, bilateral upper extremities, bilateral lower extremities, hands, feet, fingers, toes, fingernails, and toenails. All findings within normal limits unless otherwise noted below.   Relevant physical exam findings are noted in the Assessment and Plan.        Assessment & Plan   SKIN CANCER SCREENING PERFORMED TODAY.  EPIDERMAL INCLUSION CYST Left anterior base of neck at lateral Thyroidectomy scar. Discussed with pt that most likely benign cyst with recent growth.  Nevertheless, due to pt history of Thyroid  cancer and this growth being within Thyroidectomy scar, recommend evaluation and removal by ENT = Dr Milissa who did Thyroidectomy.  Much less likely that this is recurrent thyroid  cancer or Branchial arch cyst.  Exam: at left midline anterior base of neck 0.6 cm firm papule with pore see photos  - Appears to be on lateral edge of thyroid  surgery scar  - examined under  dermatoscope  - benign. Observe - Will discuss with previous ENT Dr. Carolee MOTE Vaught at [facility] who preformed thyroid  removal surgery.  Referral for: Probable Epidermoid Cyst Left anterior neck within Thyroidectomy scar / History of Thyroid  Cancer.  See Photos in Epic chart.  Benign-appearing. Exam most consistent with an epidermal inclusion cyst. Discussed that a cyst is a benign growth that can grow over time and sometimes get irritated or inflamed. Recommend observation if it is not bothersome. Discussed option of surgical excision to remove it if it is growing, symptomatic, or other changes noted. Please call for new or changing lesions so they can be evaluated.  ACTINIC DAMAGE - Chronic condition, secondary to cumulative UV/sun exposure - diffuse scaly erythematous macules with underlying dyspigmentation - Recommend daily broad spectrum sunscreen SPF 30+ to sun-exposed areas, reapply every 2 hours as needed.  - Staying in the shade or wearing long sleeves, sun glasses (UVA+UVB protection) and wide brim hats (4-inch brim around the entire circumference of the hat) are also recommended for sun protection.  - Call for new or changing lesions.  LENTIGINES, SEBORRHEIC KERATOSES, HEMANGIOMAS - Benign normal skin lesions - Benign-appearing - Call for any changes  MELANOCYTIC NEVI - Tan-brown and/or pink-flesh-colored symmetric macules and papules - Benign appearing on exam today - Observation - Call clinic for new or changing moles - Recommend daily use of broad spectrum spf 30+ sunscreen to sun-exposed areas.   Nevus   Exam: 0.5 cm right superior medial buttocks  See previous photo found in media date 05/27/2023 Benign-appearing. Stable compared  to previous visit. Observation.  Call clinic for new or changing moles.  Recommend daily use of broad spectrum spf 30+ sunscreen to sun-exposed areas.    Lipoma vs Nevus  Exam: slight elevation of skin 0.6 cm at left inferior buttocks    Treatment Plan: Benign-appearing.  Observation.  Call clinic for new or changing lesions.  Recommend daily use of broad spectrum spf 30+ sunscreen to sun-exposed areas.     DERMATOFIBROMA Right mid dorsum forearm Exam: 4 mm Firm pink/brown papulenodule with dimple sign. Treatment Plan: A dermatofibroma is a benign growth possibly related to trauma, such as an insect bite, cut from shaving, or inflamed acne-type bump.  Treatment options to remove include shave or excision with resulting scar and risk of recurrence.  Since benign-appearing and not bothersome, will observe for now.   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 Genetic in nature. No cure. - Observe. - If desired, patient can use an emollient (moisturizer) containing ammonium lactate (AmLactin), urea or salicylic acid once a day to smooth the area  Recommend starting moisturizer with exfoliant (Urea, Salicylic acid, or Lactic acid) one to two times daily to help smooth rough and bumpy skin.  OTC options include Cetaphil Rough and Bumpy lotion (Urea), Eucerin Roughness Relief lotion or spot treatment cream (Urea), CeraVe SA lotion/cream for Rough and Bumpy skin (Sal Acid), Gold Bond Rough and Bumpy cream (Sal Acid), and AmLactin 12% lotion/cream (Lactic Acid).  If applying in morning, also apply sunscreen to sun-exposed areas, since these exfoliating moisturizers can increase sensitivity to sun.   HISTORY OF MELANOMA IN SITU 11/13/2021 right posterior lateral base of neck excised 01/28/2022 08/29/2020 right prox dorsum med great toe excised 10/16/2020 - No evidence of recurrence today - Recommend regular full body skin exams - Recommend daily broad spectrum sunscreen SPF 30+ to sun-exposed areas, reapply every 2 hours as needed.  - Call if any new or changing lesions are noted between office visits  HISTORY OF DYSPLASTIC NEVUS 05/08/2021 left superior lateral calf mild 01/31/2015 left dorsum  middle toe medial aspect shave removal 03/21/2015 moderate to severe No evidence of recurrence today Recommend regular full body skin exams Recommend daily broad spectrum sunscreen SPF 30+ to sun-exposed areas, reapply every 2 hours as needed.  Call if any new or changing lesions are noted between office visits    Return in about 6 months (around 08/12/2024) for TBSE.  IEleanor Blush, CMA, am acting as scribe for Alm Rhyme, MD.   Documentation: I have reviewed the above documentation for accuracy and completeness, and I agree with the above.  Alm Rhyme, MD

## 2024-02-10 NOTE — Patient Instructions (Signed)

## 2024-03-03 ENCOUNTER — Other Ambulatory Visit: Payer: Self-pay | Admitting: Otolaryngology

## 2024-03-04 ENCOUNTER — Encounter: Payer: Self-pay | Admitting: Otolaryngology

## 2024-03-09 ENCOUNTER — Ambulatory Visit: Payer: Self-pay | Admitting: Anesthesiology

## 2024-03-09 ENCOUNTER — Other Ambulatory Visit: Payer: Self-pay

## 2024-03-09 ENCOUNTER — Encounter
Admission: RE | Disposition: A | Discharge status: Home / Self Care | Payer: Self-pay | Source: Home / Self Care | Attending: Otolaryngology

## 2024-03-09 ENCOUNTER — Ambulatory Visit
Admission: RE | Admit: 2024-03-09 | Discharge: 2024-03-09 | Disposition: A | Discharge status: Home / Self Care | Attending: Otolaryngology | Admitting: Otolaryngology

## 2024-03-09 ENCOUNTER — Encounter: Payer: Self-pay | Admitting: Otolaryngology

## 2024-03-09 DIAGNOSIS — C73 Malignant neoplasm of thyroid gland: Secondary | ICD-10-CM | POA: Insufficient documentation

## 2024-03-09 DIAGNOSIS — D489 Neoplasm of uncertain behavior, unspecified: Secondary | ICD-10-CM | POA: Diagnosis present

## 2024-03-09 HISTORY — DX: Anxiety disorder, unspecified: F41.9

## 2024-03-09 HISTORY — DX: Postprocedural hypothyroidism: E89.0

## 2024-03-09 HISTORY — PX: EXCISION MASS NECK: SHX6703

## 2024-03-09 HISTORY — DX: Depression, unspecified: F32.A

## 2024-03-09 LAB — POCT PREGNANCY, URINE: Preg Test, Ur: NEGATIVE

## 2024-03-09 SURGERY — EXCISION, MASS, NECK
Anesthesia: Monitor Anesthesia Care

## 2024-03-09 MED ORDER — MIDAZOLAM HCL 2 MG/2ML IJ SOLN
INTRAMUSCULAR | Status: AC
  Filled 2024-03-09: qty 2

## 2024-03-09 MED ORDER — PROPOFOL 500 MG/50ML IV EMUL
INTRAVENOUS | Status: DC | PRN
  Administered 2024-03-09: 140 ug/kg/min via INTRAVENOUS

## 2024-03-09 MED ORDER — FENTANYL CITRATE (PF) 100 MCG/2ML IJ SOLN
INTRAMUSCULAR | Status: DC | PRN
  Administered 2024-03-09 (×2): 50 ug via INTRAVENOUS

## 2024-03-09 MED ORDER — LIDOCAINE-EPINEPHRINE 1 %-1:100000 IJ SOLN
INTRAMUSCULAR | Status: DC | PRN
  Administered 2024-03-09: 1 mL

## 2024-03-09 MED ORDER — LACTATED RINGERS IV SOLN: INTRAVENOUS | Status: DC

## 2024-03-09 MED ORDER — HEMOSTATIC AGENTS (NO CHARGE) OPTIME
TOPICAL | Status: DC | PRN
  Administered 2024-03-09: 1 via TOPICAL

## 2024-03-09 MED ORDER — SODIUM CHLORIDE 0.9 % IV SOLN: INTRAVENOUS | Status: DC

## 2024-03-09 MED ORDER — FENTANYL CITRATE (PF) 100 MCG/2ML IJ SOLN
INTRAMUSCULAR | Status: AC
  Filled 2024-03-09: qty 2

## 2024-03-09 MED ORDER — PROPOFOL 10 MG/ML IV BOLUS
INTRAVENOUS | Status: DC | PRN
  Administered 2024-03-09: 50 mg via INTRAVENOUS

## 2024-03-09 MED ORDER — SODIUM CHLORIDE 0.9 % IR SOLN
Status: DC | PRN
  Administered 2024-03-09: 100 mL

## 2024-03-09 MED ORDER — ONDANSETRON HCL 4 MG PO TABS: 4.0000 mg | ORAL_TABLET | Freq: Three times a day (TID) | ORAL | 0 refills | Status: DC | PRN

## 2024-03-09 MED ORDER — MIDAZOLAM HCL 2 MG/2ML IJ SOLN
INTRAMUSCULAR | Status: DC | PRN
  Administered 2024-03-09: 2 mg via INTRAVENOUS

## 2024-03-09 MED ORDER — LIDOCAINE HCL (CARDIAC) PF 100 MG/5ML IV SOSY
PREFILLED_SYRINGE | INTRAVENOUS | Status: DC | PRN
  Administered 2024-03-09: 60 mg via INTRATRACHEAL

## 2024-03-09 MED ORDER — SEVOFLURANE IN SOLN
RESPIRATORY_TRACT | Status: AC
  Filled 2024-03-09: qty 250

## 2024-03-09 SURGICAL SUPPLY — 17 items
APPLICATOR COTTON TIP 3IN (MISCELLANEOUS) ×1 IMPLANT
CORD BIP STRL DISP 12FT (MISCELLANEOUS) IMPLANT
DERMABOND ADVANCED .7 DNX12 (GAUZE/BANDAGES/DRESSINGS) IMPLANT
DRSG TELFA 4X3 1S NADH ST (GAUZE/BANDAGES/DRESSINGS) IMPLANT
ELECTRODE REM PT RTRN 9FT ADLT (ELECTROSURGICAL) ×1 IMPLANT
GLOVE SURG GAMMEX PI TX LF 7.5 (GLOVE) ×2 IMPLANT
GOWN STRL REUS W/ TWL LRG LVL3 (GOWN DISPOSABLE) ×1 IMPLANT
HEMOSTAT SURGICEL 2X3 (HEMOSTASIS) IMPLANT
KIT TURNOVER KIT A (KITS) ×1 IMPLANT
NS IRRIG 500ML POUR BTL (IV SOLUTION) ×1 IMPLANT
PACK ENT CUSTOM (PACKS) ×1 IMPLANT
SOLUTION PREP PVP 2OZ (MISCELLANEOUS) ×1 IMPLANT
STRAP BODY AND KNEE 60X3 (MISCELLANEOUS) ×1 IMPLANT
STRIP CLOSURE SKIN 1/4X4 (GAUZE/BANDAGES/DRESSINGS) IMPLANT
SUCTION TUBE FRAZIER 10FR DISP (SUCTIONS) IMPLANT
SUT VICRYL 5-0 (SUTURE) IMPLANT
SYR EAR/ULCER 2OZ (SYRINGE) ×1 IMPLANT

## 2024-03-09 NOTE — H&P (Signed)
..  History and Physical paper copy reviewed and updated date of procedure and will be scanned into system.  Patient seena dne xeamined and marked.

## 2024-03-09 NOTE — Op Note (Signed)
..  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  Intimately attached to darker fat lobule as well that was excised in case of lipomatous growth.  All areas of concern removed without issue.  Procedure: With the patient in a comfortable supine position, IV sedations was administered.  The patient's anterior neck was prepped and draped in a sterile fashion with previous neck mass marked.  This was then injected with 1ml of 1% lidocaine  with 1:100,000 epinephrine .  The skin was incised in an elliptical fashion overlying the neck mass.  This was sharply dissected with scissors incorporating a subcutaneous nodule into the overlying skin.  A large lobule of dark lipomatous tissue was intimately associated with this neck mass and this was excised in addition to the subcutaneous growth.  This was taken down to fibrotic scar tissue from previous total thyroidectomy.  No additional neck masses were palpated or seen.  The wound was cleaned.  Surgicel was placed in the wound bed.  The wound was closed with deep vicryl and skin closed with Dermabond and steri-strip.    Following this  The patient was returned to anesthesia, awakened, and transferred to recovery in stable condition.  Dispo:  PACU to home  Plan: Follow pathoogy.  Recheck my office three weeks.   [name] 11:25 AM 03/09/2024

## 2024-03-09 NOTE — Anesthesia Postprocedure Evaluation (Signed)
 Anesthesia Post Note  Patient: Latasha Cannon  Procedure(s) Performed: EXCISION, MASS, NECK  Patient location during evaluation: PACU Anesthesia Type: MAC Level of consciousness: awake and alert Pain management: pain level controlled Vital Signs Assessment: post-procedure vital signs reviewed and stable Respiratory status: spontaneous breathing, nonlabored ventilation, respiratory function stable and patient connected to nasal cannula oxygen Cardiovascular status: stable and blood pressure returned to baseline Postop Assessment: no apparent nausea or vomiting Anesthetic complications: no   No notable events documented.   Last Vitals:  Vitals:   03/09/24 1130 03/09/24 1145  BP: 112/72 102/62  Pulse: 89 76  Resp: 12 15  Temp:  (!) 36.3 C  SpO2: 98% 100%    Last Pain:  Vitals:   03/09/24 1145  TempSrc:   PainSc: 0-No pain                 Ikechukwu Cerny C Mayleigh Tetrault

## 2024-03-09 NOTE — Transfer of Care (Signed)
 Immediate Anesthesia Transfer of Care Note  Patient: Latasha Cannon  Procedure(s) Performed: EXCISION, MASS, NECK  Patient Location: PACU  Anesthesia Type: MAC  Level of Consciousness: awake, alert  and patient cooperative  Airway and Oxygen Therapy: Patient Spontanous Breathing and Patient connected to supplemental oxygen  Post-op Assessment: Post-op Vital signs reviewed, Patient's Cardiovascular Status Stable, Respiratory Function Stable, Patent Airway and No signs of Nausea or vomiting  Post-op Vital Signs: Reviewed and stable  Complications: No notable events documented.

## 2024-03-09 NOTE — Anesthesia Preprocedure Evaluation (Signed)
 Anesthesia Evaluation  Patient identified by MRN, date of birth, ID band Patient awake    Reviewed: Allergy & Precautions, H&P , NPO status , Patient's Chart, lab work & pertinent test results  Airway Mallampati: II  TM Distance: >3 FB Neck ROM: Full    Dental no notable dental hx.    Pulmonary neg pulmonary ROS   Pulmonary exam normal breath sounds clear to auscultation       Cardiovascular hypertension, negative cardio ROS Normal cardiovascular exam Rhythm:Regular Rate:Normal     Neuro/Psych  PSYCHIATRIC DISORDERS Anxiety Depression    negative neurological ROS  negative psych ROS   GI/Hepatic negative GI ROS, Neg liver ROS,,,  Endo/Other  negative endocrine ROSHypothyroidism    Renal/GU negative Renal ROS  negative genitourinary   Musculoskeletal negative musculoskeletal ROS (+)    Abdominal   Peds negative pediatric ROS (+)  Hematology negative hematology ROS (+) Blood dyscrasia, anemia   Anesthesia Other Findings Cancer  Melanoma in situ  Atypical mole  Dysplastic nevus Melanoma in situ   Anxiety Postsurgical hypothyroidism Depression    Reproductive/Obstetrics negative OB ROS                              Anesthesia Physical Anesthesia Plan  ASA: 2  Anesthesia Plan: MAC   Post-op Pain Management:    Induction: Intravenous  PONV Risk Score and Plan:   Airway Management Planned: Natural Airway and Nasal Cannula  Additional Equipment:   Intra-op Plan:   Post-operative Plan:   Informed Consent: I have reviewed the patients History and Physical, chart, labs and discussed the procedure including the risks, benefits and alternatives for the proposed anesthesia with the patient or authorized representative who has indicated his/her understanding and acceptance.     Dental Advisory Given  Plan Discussed with: Anesthesiologist, CRNA and Surgeon  Anesthesia Plan  Comments: (Patient consented for risks of anesthesia including but not limited to:  - adverse reactions to medications - damage to eyes, teeth, lips or other oral mucosa - nerve damage due to positioning  - sore throat or hoarseness - Damage to heart, brain, nerves, lungs, other parts of body or loss of life  Patient voiced understanding and assent. Discussed MAC versus general anesthesia with LMA, patient agreeable to either one or both)        Anesthesia Quick Evaluation

## 2024-03-14 LAB — SURGICAL PATHOLOGY

## 2024-03-16 ENCOUNTER — Encounter: Payer: Self-pay | Admitting: Otolaryngology

## 2024-03-24 ENCOUNTER — Encounter: Payer: Self-pay | Admitting: Family Medicine

## 2024-03-25 ENCOUNTER — Other Ambulatory Visit: Payer: Self-pay

## 2024-03-25 DIAGNOSIS — F419 Anxiety disorder, unspecified: Secondary | ICD-10-CM

## 2024-03-25 DIAGNOSIS — N920 Excessive and frequent menstruation with regular cycle: Secondary | ICD-10-CM

## 2024-03-25 MED ORDER — CITALOPRAM HYDROBROMIDE 20 MG PO TABS: 20.0000 mg | ORAL_TABLET | Freq: Every day | ORAL | 0 refills | Status: DC

## 2024-03-25 MED ORDER — NORETHINDRONE 0.35 MG PO TABS: 1.0000 | ORAL_TABLET | Freq: Every day | ORAL | 0 refills | Status: DC

## 2024-03-25 NOTE — Telephone Encounter (Signed)
 Has not been seen since Oct 2024 and since these will be for another year, just wanted to make sure it was ok to do it like that, she does have follow up in October    Hi Dr. B,  I will see you for my physical on October 20th but my birth control prescription has run out- I have enough to pills make it to September 20th.  Could you go ahead and submit a new prescription for it?  It's norethindrone  0.35 mg and I still use [facility] on [address].  Right now I'm getting a 3 month supply at every refill.  I donhave enough Citalopram  to make it until my physical. Thank you! Latasha Cannon

## 2024-03-30 ENCOUNTER — Other Ambulatory Visit: Payer: Self-pay | Admitting: Internal Medicine

## 2024-03-30 DIAGNOSIS — C73 Malignant neoplasm of thyroid gland: Secondary | ICD-10-CM

## 2024-04-06 NOTE — Written Directive (Addendum)
 MOLECULAR IMAGING AND THERAPEUTICS WRITTEN DIRECTIVE   PATIENT NAME: Latasha Cannon  PT DOB:   [birth year]                                              MRN: [id number]  ---------------------------------------------------------------------------------------------------------------------  I-131 WHOLE BODY SCAN    RADIOPHARMACEUTICAL: Iodine-131 Capsule for Diagnostic Imaging   PRESCRIBED DOSE FOR ADMINISTRATION: 4 mCi Thyrogen  inj 04/11/24, 04/12/24 and I131 Caps for 04/13/24   ROUTE OFADMINISTRATION: PO   DIAGNOSIS: Papillary thyroid  carcinoma    REFERRING PHYSICIAN: Therisa Leep, MD   THYROGEN  STIMULATION OR HORMONE WITHDRAW: Thyroid  Stimulation   DATE OF THYROIDECTOMY: Thyroidectomy 08/14/2013  Excision, Mass, Neck 03/09/2024   SURGEON: Carolee Hunter MD   TSH:   Lab Results  Component Value Date   TSH 3.970 03/05/2020   TSH 0.35 (A) 12/08/2018   TSH 0.54 11/25/2017   [facility] TSH 12/24/23 0.114  PRIOR I-131 THERAPY (Date and Dose):   ADDITIONAL PHYSICIAN COMMENTS/NOTES   AUTHORIZED USER SIGNATURE & TIME STAMP: Norleen GORMAN Boxer, MD   04/06/24    10:27 AM

## 2024-04-11 ENCOUNTER — Other Ambulatory Visit
Admission: RE | Admit: 2024-04-11 | Discharge: 2024-04-11 | Disposition: A | Discharge status: Home / Self Care | Source: Ambulatory Visit | Attending: Internal Medicine | Admitting: Internal Medicine

## 2024-04-11 ENCOUNTER — Ambulatory Visit
Admission: RE | Admit: 2024-04-11 | Discharge: 2024-04-11 | Disposition: A | Discharge status: Home / Self Care | Source: Ambulatory Visit | Attending: Internal Medicine | Admitting: Internal Medicine

## 2024-04-11 DIAGNOSIS — C73 Malignant neoplasm of thyroid gland: Secondary | ICD-10-CM | POA: Diagnosis present

## 2024-04-11 LAB — PREGNANCY, URINE: Preg Test, Ur: NEGATIVE

## 2024-04-11 MED ORDER — THYROTROPIN ALFA 0.9 MG IM SOLR
0.9000 mg | INTRAMUSCULAR | Status: AC
  Administered 2024-04-11: 0.9 mg via INTRAMUSCULAR

## 2024-04-12 ENCOUNTER — Ambulatory Visit
Admission: RE | Admit: 2024-04-12 | Discharge: 2024-04-12 | Disposition: A | Discharge status: Home / Self Care | Source: Ambulatory Visit | Attending: Internal Medicine | Admitting: Internal Medicine

## 2024-04-12 DIAGNOSIS — C73 Malignant neoplasm of thyroid gland: Secondary | ICD-10-CM | POA: Diagnosis not present

## 2024-04-12 MED ORDER — THYROTROPIN ALFA 0.9 MG IM SOLR
0.9000 mg | INTRAMUSCULAR | Status: AC
  Administered 2024-04-12: 0.9 mg via INTRAMUSCULAR

## 2024-04-13 ENCOUNTER — Ambulatory Visit
Admission: RE | Admit: 2024-04-13 | Discharge: 2024-04-13 | Disposition: A | Discharge status: Home / Self Care | Source: Ambulatory Visit | Attending: Internal Medicine | Admitting: Internal Medicine

## 2024-04-13 DIAGNOSIS — C73 Malignant neoplasm of thyroid gland: Secondary | ICD-10-CM | POA: Diagnosis not present

## 2024-04-13 MED ORDER — SODIUM IODIDE I 131 CAPSULE
4.2900 | Freq: Once | INTRAVENOUS | Status: AC | PRN
Start: 2024-04-13 — End: 2024-04-13
  Administered 2024-04-13: 4.29 via ORAL

## 2024-04-15 ENCOUNTER — Ambulatory Visit
Admission: RE | Admit: 2024-04-15 | Discharge: 2024-04-15 | Disposition: A | Discharge status: Home / Self Care | Source: Ambulatory Visit | Attending: Internal Medicine | Admitting: Internal Medicine

## 2024-04-15 DIAGNOSIS — C73 Malignant neoplasm of thyroid gland: Secondary | ICD-10-CM | POA: Diagnosis not present

## 2024-04-26 ENCOUNTER — Encounter: Payer: Self-pay | Admitting: Family Medicine

## 2024-04-26 DIAGNOSIS — E559 Vitamin D deficiency, unspecified: Secondary | ICD-10-CM

## 2024-04-26 DIAGNOSIS — E785 Hyperlipidemia, unspecified: Secondary | ICD-10-CM

## 2024-04-27 NOTE — Telephone Encounter (Signed)
 Ok to order Vit d, CMP, lipids - use diagnoses HLD, Vit D deficiency from her problem list. Recommend that she get them at least 2 days before her appt with me. Get them fasting. Thanks

## 2024-05-05 ENCOUNTER — Ambulatory Visit: Payer: Self-pay | Admitting: Family Medicine

## 2024-05-05 LAB — COMPREHENSIVE METABOLIC PANEL WITH GFR
ALT: 15 IU/L (ref 0–32)
AST: 15 IU/L (ref 0–40)
Albumin: 4.8 g/dL (ref 3.9–4.9)
Alkaline Phosphatase: 79 IU/L (ref 41–116)
BUN/Creatinine Ratio: 15 (ref 9–23)
BUN: 12 mg/dL (ref 6–24)
Bilirubin Total: 0.4 mg/dL (ref 0.0–1.2)
CO2: 21 mmol/L (ref 20–29)
Calcium: 9.7 mg/dL (ref 8.7–10.2)
Chloride: 104 mmol/L (ref 96–106)
Creatinine, Ser: 0.8 mg/dL (ref 0.57–1.00)
Globulin, Total: 2.4 g/dL (ref 1.5–4.5)
Glucose: 97 mg/dL (ref 70–99)
Potassium: 4.3 mmol/L (ref 3.5–5.2)
Sodium: 140 mmol/L (ref 134–144)
Total Protein: 7.2 g/dL (ref 6.0–8.5)
eGFR: 94 mL/min/1.73 (ref >59)

## 2024-05-05 LAB — LIPID PANEL WITH LDL/HDL RATIO
Cholesterol, Total: 173 mg/dL (ref 100–199)
HDL: 37 mg/dL — ABNORMAL LOW (ref >39)
LDL Chol Calc (NIH): 116 mg/dL — ABNORMAL HIGH (ref 0–99)
LDL/HDL Ratio: 3.1 ratio (ref 0.0–3.2)
Triglycerides: 111 mg/dL (ref 0–149)
VLDL Cholesterol Cal: 20 mg/dL (ref 5–40)

## 2024-05-05 LAB — VITAMIN D 25 HYDROXY (VIT D DEFICIENCY, FRACTURES): Vit D, 25-Hydroxy: 41.7 ng/mL (ref 30.0–100.0)

## 2024-05-09 ENCOUNTER — Ambulatory Visit (INDEPENDENT_AMBULATORY_CARE_PROVIDER_SITE_OTHER): Payer: Self-pay | Admitting: Family Medicine

## 2024-05-09 ENCOUNTER — Encounter: Payer: Self-pay | Admitting: Family Medicine

## 2024-05-09 VITALS — BP 119/57 | HR 92 | Ht 64.17 in | Wt 135.7 lb

## 2024-05-09 DIAGNOSIS — Z23 Encounter for immunization: Secondary | ICD-10-CM

## 2024-05-09 DIAGNOSIS — K5909 Other constipation: Secondary | ICD-10-CM

## 2024-05-09 DIAGNOSIS — N920 Excessive and frequent menstruation with regular cycle: Secondary | ICD-10-CM

## 2024-05-09 DIAGNOSIS — F419 Anxiety disorder, unspecified: Secondary | ICD-10-CM

## 2024-05-09 DIAGNOSIS — C73 Malignant neoplasm of thyroid gland: Secondary | ICD-10-CM

## 2024-05-09 DIAGNOSIS — Z0000 Encounter for general adult medical examination without abnormal findings: Secondary | ICD-10-CM

## 2024-05-09 DIAGNOSIS — E890 Postprocedural hypothyroidism: Secondary | ICD-10-CM

## 2024-05-09 DIAGNOSIS — Z1231 Encounter for screening mammogram for malignant neoplasm of breast: Secondary | ICD-10-CM | POA: Diagnosis not present

## 2024-05-09 MED ORDER — CITALOPRAM HYDROBROMIDE 20 MG PO TABS: 30.0000 mg | ORAL_TABLET | Freq: Every day | ORAL | 1 refills | Status: AC

## 2024-05-09 MED ORDER — NORETHINDRONE 0.35 MG PO TABS: 1.0000 | ORAL_TABLET | Freq: Every day | ORAL | 3 refills | Status: AC

## 2024-05-09 NOTE — Assessment & Plan Note (Signed)
 Recent recurrence at the incision line, surgically removed. Biopsy confirmed same thyroid  cancer. Under care of thyroid  specialist for monitoring. Recent scan clear. - Continue follow-up with thyroid  specialist for monitoring of cancer markers and thyroid  function tests.

## 2024-05-09 NOTE — Assessment & Plan Note (Signed)
 Hypothyroidism following thyroidectomy for thyroid  cancer. Managed with thyroid  hormone replacement therapy. - Continue current thyroid  hormone replacement therapy.

## 2024-05-09 NOTE — Assessment & Plan Note (Signed)
 managed with citalopram  20 mg. Experiencing fatigue and overwhelm, possibly related to postpartum changes. Current dose may be insufficient. - Increase citalopram  to 30 mg daily by taking one and a half of the 20 mg tablets. - Monitor for side effects and effectiveness over several weeks

## 2024-05-09 NOTE — Patient Instructions (Signed)
 Call [facility] to schedule a mammogram [phone]

## 2024-05-09 NOTE — Progress Notes (Signed)
 Complete physical exam   Patient: Latasha Cannon   DOB: [birth year]   43 y.o. Female  MRN: [id number] Visit Date: 05/09/2024  Today's healthcare provider: Jon Eva, MD   Chief Complaint  Patient presents with   Annual Exam    Last completed 05/07/23 Diet -  healthy Exercise - no Feeling - fairly well Sleeping - fairly well Concerns -  discussing labs   Immunizations    Infleunza Pneumococcal HPV Hepatitis B    Subjective    Latasha Cannon is a 43 y.o. female who presents today for a complete physical exam.   Discussed the use of AI scribe software for clinical note transcription with the patient, who gave verbal consent to proceed.  History of Present Illness   Latasha Cannon is a 43 year old female with a history of thyroid  cancer and hypothyroidism who presents for a follow-up visit and vaccination discussion.  She underwent thyroidectomy ten years ago for thyroid  cancer, with a recurrence surgically removed this past summer. Her recent scan is clear, and her thyroid  doctor monitors her blood work. She takes thyroid  medication in the morning, waiting an hour before eating to ensure proper absorption.  Her LDL cholesterol levels have been gradually increasing over the past three years, though her ten-year heart disease risk score remains low at 0.7%.  She experiences chronic constipation despite a diet rich in fruits and vegetables and is considering using Miralax again.  She takes citalopram  20 mg daily for anxiety and depression but feels overwhelmed and tired, considering a dose increase.  She uses a progesterone-only birth control pill, norethindrone , continuously to avoid periods but still experiences PMS symptoms. Her family history of stroke and migraines with aura influenced her choice of birth control.  She is considering pneumococcal and flu vaccinations today.        Last depression screening scores    05/09/2024    8:39 AM 12/08/2023    9:08  AM 05/07/2023    8:37 AM  PHQ 2/9 Scores  PHQ - 2 Score 2 1 0  PHQ- 9 Score 6 5 1    Last fall risk screening    01/08/2024   10:18 AM  Fall Risk   Falls in the past year? 0  Number falls in past yr: 0  Injury with Fall? 0  Risk for fall due to : No Fall Risks  Follow up Falls evaluation completed        Medications: Outpatient Medications Prior to Visit  Medication Sig   Cholecalciferol 50 MCG (2000 UT) TABS Take by mouth.   fluticasone  (FLONASE ) 50 MCG/ACT nasal spray Place 1 spray into both nostrils as needed for allergies or rhinitis.   levothyroxine  (SYNTHROID ) 88 MCG tablet Take 88 mcg by mouth daily before breakfast.   loratadine (CLARITIN) 10 MG tablet Take 10 mg by mouth daily.   [DISCONTINUED] citalopram  (CELEXA ) 20 MG tablet Take 1 tablet (20 mg total) by mouth daily.   [DISCONTINUED] norethindrone  (CAMILA ) 0.35 MG tablet Take 1 tablet (0.35 mg total) by mouth daily.   [DISCONTINUED] benzonatate  (TESSALON ) 100 MG capsule Take 1 capsule (100 mg total) by mouth 2 (two) times daily as needed for cough. (Patient not taking: Reported on 05/09/2024)   [DISCONTINUED] cetirizine (ZYRTEC) 10 MG chewable tablet Chew 10 mg by mouth daily. (Patient not taking: Reported on 05/09/2024)   [DISCONTINUED] ondansetron  (ZOFRAN ) 4 MG tablet Take 1 tablet (4 mg total) by mouth every 8 (eight) hours as  needed for nausea or vomiting. (Patient not taking: Reported on 05/09/2024)   No facility-administered medications prior to visit.    Review of Systems    Objective    BP (!) 119/57 (BP Location: Left Arm, Patient Position: Sitting, Cuff Size: Normal)   Pulse 92   Ht 5' 4.17 (1.63 m)   Wt 135 lb 11.2 oz (61.6 kg)   SpO2 100%   BMI 23.17 kg/m    Physical Exam Vitals reviewed.  Constitutional:      General: She is not in acute distress.    Appearance: Normal appearance. She is well-developed. She is not diaphoretic.  HENT:     Head: Normocephalic and atraumatic.     Right  Ear: Tympanic membrane, ear canal and external ear normal.     Left Ear: Tympanic membrane, ear canal and external ear normal.     Nose: Nose normal.     Mouth/Throat:     Mouth: Mucous membranes are moist.     Pharynx: Oropharynx is clear. No oropharyngeal exudate.  Eyes:     General: No scleral icterus.    Conjunctiva/sclera: Conjunctivae normal.     Pupils: Pupils are equal, round, and reactive to light.  Neck:     Thyroid : No thyromegaly.  Cardiovascular:     Rate and Rhythm: Normal rate and regular rhythm.     Heart sounds: Normal heart sounds. No murmur heard. Pulmonary:     Effort: Pulmonary effort is normal. No respiratory distress.     Breath sounds: Normal breath sounds. No wheezing or rales.  Abdominal:     General: There is no distension.     Palpations: Abdomen is soft.     Tenderness: There is no abdominal tenderness.  Musculoskeletal:        General: No deformity.     Cervical back: Neck supple.     Right lower leg: No edema.     Left lower leg: No edema.  Lymphadenopathy:     Cervical: No cervical adenopathy.  Skin:    General: Skin is warm and dry.     Findings: No rash.  Neurological:     Mental Status: She is alert and oriented to person, place, and time. Mental status is at baseline.     Gait: Gait normal.  Psychiatric:        Mood and Affect: Mood normal.        Behavior: Behavior normal.        Thought Content: Thought content normal.      No results found for any visits on 05/09/24.  Assessment & Plan    Routine Health Maintenance and Physical Exam  Exercise Activities and Dietary recommendations  Goals   None     Immunization History  Administered Date(s) Administered    sv, Bivalent, Protein Subunit Rsvpref,pf (Abrysvo) 08/28/2022   Influenza, Seasonal, Injecte, Preservative Fre 05/07/2023, 05/09/2024   Influenza,inj,Quad PF,6+ Mos 04/20/2019, 04/11/2020, 04/24/2021, 04/09/2022   Influenza-Unspecified 04/20/2018   MMR 06/20/1996    PFIZER(Purple Top)SARS-COV-2 Vaccination 09/30/2019, 10/26/2019, 06/22/2020   PNEUMOCOCCAL CONJUGATE-20 05/09/2024   Pfizer(Comirnaty)Fall Seasonal Vaccine 12 years and older 04/22/2023   Tdap 02/22/2015, 07/31/2022   Varicella 06/20/1996, 07/25/1996, 10/05/2022    Health Maintenance  Topic Date Due   Hepatitis B Vaccines 19-59 Average Risk (1 of 3 - 19+ 3-dose series) Never done   HPV VACCINES (1 - Risk 3-dose SCDM series) Never done   COVID-19 Vaccine (5 - 2025-26 season) 03/21/2024   Mammogram  05/19/2025  Cervical Cancer Screening (HPV/Pap Cotest)  05/06/2028   DTaP/Tdap/Td (3 - Td or Tdap) 07/31/2032   Pneumococcal Vaccine  Completed   Influenza Vaccine  Completed   Hepatitis C Screening  Completed   HIV Screening  Completed   Meningococcal B Vaccine  Aged Out    Discussed health benefits of physical activity, and encouraged her to engage in regular exercise appropriate for her age and condition.  Problem List Items Addressed This Visit       Endocrine   Papillary thyroid  carcinoma (HCC)   Post-surgical hypothyroidism     Other   Anxiety   Relevant Medications   citalopram  (CELEXA ) 20 MG tablet   Menorrhagia   Relevant Medications   norethindrone  (CAMILA ) 0.35 MG tablet   Other Visit Diagnoses       Encounter for annual physical exam    -  Primary     Breast cancer screening by mammogram       Relevant Orders   MM 3D SCREENING MAMMOGRAM BILATERAL BREAST     Immunization due       Relevant Orders   Flu vaccine trivalent PF, 6mos and older(Flulaval,Afluria,Fluarix,Fluzone) (Completed)   Pneumococcal conjugate vaccine 20-valent (Completed)           Chronic constipation Chronic constipation persists despite high dietary fiber. Fiber supplements unlikely to help. Exercise may alleviate symptoms. Miralax recommended as an osmotic laxative. - Start Miralax, one capful daily, preferably with breakfast. Increase dosage if needed.  Mildly elevated LDL  cholesterol Mildly elevated LDL cholesterol, trending upwards. Current levels do not warrant medication; 10-year heart disease risk score is 0.7%. Increased fast food intake may contribute. - Monitor LDL levels. - Encourage dietary modifications to reduce fast food intake.  Pneumococcal vaccination Due for pneumococcal vaccination due to high-risk status from cancer history. Eligible for a dose before age 26. - Administer pneumococcal vaccine today. - Administer flu vaccine today.  PMS Experiencing PMS symptoms despite continuous use of progesterone-only birth control. Symptoms include mood changes and fatigue. History of migraine with aura and family history of stroke influenced birth control choice. - Continue current progesterone-only birth control regimen.  Migraine with aura Migraine with aura influenced decision to use progesterone-only birth control due to family history of stroke. - Continue current management.  Allergic rhinitis Managed with daily allergy medication. Recent increase in symptoms due to seasonal changes. Using cetirizine instead of loratadine, acceptable based on response. - Continue daily allergy medication. - Adjust type of medication based on individual response.  General Health Maintenance Mammogram due for routine screening. Discussed potential need for hepatitis B vaccination. Colonoscopy to start at age 89. Vitamin D  levels adequate with current supplementation. - Order mammogram. - Check hepatitis B immunity status with next labs. - Discuss colonoscopy at next physical. - Continue current vitamin D  supplementation.        Return in about 6 months (around 11/07/2024) for chronic disease f/u.     Jon Eva, MD  [facility] [phone] (phone) [phone] (fax)  [facility]

## 2024-05-10 ENCOUNTER — Encounter: Payer: Self-pay | Admitting: Family Medicine

## 2024-06-08 ENCOUNTER — Ambulatory Visit
Admission: RE | Admit: 2024-06-08 | Discharge: 2024-06-08 | Disposition: A | Discharge status: Home / Self Care | Source: Ambulatory Visit | Attending: Family Medicine | Admitting: Family Medicine

## 2024-06-08 DIAGNOSIS — Z1231 Encounter for screening mammogram for malignant neoplasm of breast: Secondary | ICD-10-CM | POA: Diagnosis present

## 2024-06-15 ENCOUNTER — Ambulatory Visit: Payer: Self-pay | Admitting: Physician Assistant

## 2024-06-29 NOTE — Addendum Note (Signed)
 Encounter addended by: Gladis Burnard SAILOR on: 06/29/2024 1:11 PM  Actions taken: Imaging Exam begun

## 2024-08-17 ENCOUNTER — Ambulatory Visit: Admitting: Dermatology

## 2024-08-17 ENCOUNTER — Encounter: Payer: Self-pay | Admitting: Dermatology

## 2024-08-17 DIAGNOSIS — L578 Other skin changes due to chronic exposure to nonionizing radiation: Secondary | ICD-10-CM

## 2024-08-17 DIAGNOSIS — Z86018 Personal history of other benign neoplasm: Secondary | ICD-10-CM

## 2024-08-17 DIAGNOSIS — Z85850 Personal history of malignant neoplasm of thyroid: Secondary | ICD-10-CM | POA: Diagnosis not present

## 2024-08-17 DIAGNOSIS — L814 Other melanin hyperpigmentation: Secondary | ICD-10-CM

## 2024-08-17 DIAGNOSIS — L821 Other seborrheic keratosis: Secondary | ICD-10-CM | POA: Diagnosis not present

## 2024-08-17 DIAGNOSIS — L858 Other specified epidermal thickening: Secondary | ICD-10-CM | POA: Diagnosis not present

## 2024-08-17 DIAGNOSIS — D2261 Melanocytic nevi of right upper limb, including shoulder: Secondary | ICD-10-CM | POA: Diagnosis not present

## 2024-08-17 DIAGNOSIS — D229 Melanocytic nevi, unspecified: Secondary | ICD-10-CM

## 2024-08-17 DIAGNOSIS — L989 Disorder of the skin and subcutaneous tissue, unspecified: Secondary | ICD-10-CM

## 2024-08-17 DIAGNOSIS — Z85820 Personal history of malignant melanoma of skin: Secondary | ICD-10-CM

## 2024-08-17 DIAGNOSIS — Z1283 Encounter for screening for malignant neoplasm of skin: Secondary | ICD-10-CM

## 2024-08-17 DIAGNOSIS — Z7189 Other specified counseling: Secondary | ICD-10-CM

## 2024-08-17 DIAGNOSIS — D225 Melanocytic nevi of trunk: Secondary | ICD-10-CM

## 2024-08-17 DIAGNOSIS — D239 Other benign neoplasm of skin, unspecified: Secondary | ICD-10-CM

## 2024-08-17 DIAGNOSIS — W908XXA Exposure to other nonionizing radiation, initial encounter: Secondary | ICD-10-CM

## 2024-08-17 DIAGNOSIS — L918 Other hypertrophic disorders of the skin: Secondary | ICD-10-CM

## 2024-08-17 NOTE — Progress Notes (Signed)
 "  Follow-Up Visit   Subjective  Latasha Cannon is a 44 y.o. female who presents for the following: Skin Cancer Screening and Full Body Skin Exam Hx of mmis Hx of thyroid  cancer Hx of isks   Skin tag under left arm would like removed or treated bothers while shaving   The patient presents for Total-Body Skin Exam (TBSE) for skin cancer screening and mole check. The patient has spots, moles and lesions to be evaluated, some may be new or changing and the patient may have concern these could be cancer.  The following portions of the chart were reviewed this encounter and updated as appropriate: medications, allergies, medical history  Review of Systems:  No other skin or systemic complaints except as noted in HPI or Assessment and Plan.  Objective  Well appearing patient in no apparent distress; mood and affect are within normal limits.  A full examination was performed including scalp, head, eyes, ears, nose, lips, neck, chest, axillae, abdomen, back, buttocks, bilateral upper extremities, bilateral lower extremities, hands, feet, fingers, toes, fingernails, and toenails. All findings within normal limits unless otherwise noted below.   Relevant physical exam findings are noted in the Assessment and Plan.              left axilla x 2, right axilla x 3, left neck x 1, right neck x 1 (7) Fleshy, skin-colored pedunculated papules.    Assessment & Plan    HISTORY OF RECURRENT THYROID  CANCER AT LEFT ANTERIOR BASE OF NECK  Hx of Thyroid  Cancer In July 2025 exam I noticed a nodule at Thyroid  Cancer Excision scar and sent back to Dr Milissa - Excision of this showed recurrence of thyroid  cancer  At left anterior base of neck at lateral  Had surgery  Dr. 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  Call if any new or changing lesions are noted between office visits   SKIN CANCER SCREENING PERFORMED TODAY.  ACTINIC DAMAGE - Chronic condition, secondary to cumulative UV/sun exposure - diffuse scaly erythematous macules with underlying dyspigmentation - Recommend daily broad spectrum sunscreen SPF 30+ to sun-exposed areas, reapply every 2 hours as needed.  - Staying in the shade or wearing long sleeves, sun glasses (UVA+UVB protection) and wide brim hats (4-inch brim around the entire circumference of the hat) are also recommended for sun protection.  - Call for new or changing lesions.  LENTIGINES, SEBORRHEIC KERATOSES, HEMANGIOMAS - Benign normal skin lesions - Benign-appearing - Call for any changes  MELANOCYTIC NEVI - Tan-brown and/or pink-flesh-colored symmetric macules and papules - Benign appearing on exam today - Observation - Call clinic for new or changing moles - Recommend daily use of broad spectrum spf 30+ sunscreen to sun-exposed areas.  Nevus   Exam: 0.5 cm right superior medial buttocks  See previous photo found in media date 05/27/2023 Benign-appearing. Stable compared to previous visit. Observation.  Call clinic for new or changing moles.  Recommend daily use of broad spectrum spf 30+ sunscreen to sun-exposed areas.    Lipoma vs Nevus  Exam: slight elevation of skin  0.6 cm at left inferior buttocks  Treatment Plan: Benign-appearing.  Observation.  Call clinic for new or changing lesions.  Recommend daily use of broad spectrum spf 30+ sunscreen to sun-exposed areas.    DERMATOFIBROMA Right mid dorsum forearm Exam: 4 mm Firm pink/brown papulenodule with dimple sign. Treatment Plan: A dermatofibroma is a benign growth possibly related to trauma, such as an insect bite, cut from shaving, or inflamed acne-type bump.  Treatment options to remove include shave or excision with resulting scar and risk of recurrence.  Since benign-appearing and not bothersome, will observe for now.    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 Genetic in nature. No cure. - Observe. - If desired, patient can use an emollient (moisturizer) containing ammonium lactate (AmLactin), urea or salicylic acid once a day to smooth the area  Recommend starting moisturizer with exfoliant (Urea, Salicylic acid, or Lactic acid) one to two times daily to help smooth rough and bumpy skin.  OTC options include Cetaphil Rough and Bumpy lotion (Urea), Eucerin Roughness Relief lotion or spot treatment cream (Urea), CeraVe SA lotion/cream for Rough and Bumpy skin (Sal Acid), Gold Bond Rough and Bumpy cream (Sal Acid), and AmLactin 12% lotion/cream (Lactic Acid).  If applying in morning, also apply sunscreen to sun-exposed areas, since these exfoliating moisturizers can increase sensitivity to sun.  SKIN TAG (7) left axilla x 2, right axilla x 3, left neck x 1, right neck x 1 (7) Benign-appearing.  Observation.  Call clinic for new or changing lesions.  Recommend daily use of broad spectrum spf 30+ sunscreen to sun-exposed areas.   Discussed Skin tag removal generally is considered cosmetic and not covered by insurance.    Patient is bothered by areas and would like to pay and have treated   Patent signed consent for non covered procedure   Discussed Cosmetic  skin tag removal is $120 for up to 15 tags and $22 for each additional.     - Destruction of lesion - left axilla x 2, right axilla x 3, left neck x 1, right neck x 1 (7) Complexity: simple   Destruction method: cryotherapy   Informed consent: discussed and consent obtained   Timeout:  patient name, date of birth, surgical site, and procedure verified Lesion destroyed using liquid nitrogen: Yes   Region frozen until ice ball extended beyond lesion: Yes   Outcome: patient tolerated procedure well with no complications   Post-procedure details: wound care instructions given    Return in about 6 months (around 02/14/2025) for TBSE.  IEleanor Blush, CMA, am acting as scribe for Alm Rhyme, MD.   Documentation: I have reviewed the above documentation for accuracy and completeness, and I agree with the above.  Alm Rhyme, MD    "

## 2024-08-17 NOTE — Patient Instructions (Addendum)
 Cryotherapy Aftercare  Wash gently with soap and water everyday.   Apply Vaseline and Band-Aid daily until healed.   Melanoma ABCDEs  Melanoma is the most dangerous type of skin cancer, and is the leading cause of death from skin disease.  You are more likely to develop melanoma if you: Have light-colored skin, light-colored eyes, or red or blond hair Spend a lot of time in the sun Tan regularly, either outdoors or in a tanning bed Have had blistering sunburns, especially during childhood Have a close family member who has had a melanoma Have atypical moles or large birthmarks  Early detection of melanoma is key since treatment is typically straightforward and cure rates are extremely high if we catch it early.   The first sign of melanoma is often a change in a mole or a new dark spot.  The ABCDE system is a way of remembering the signs of melanoma.  A for asymmetry:  The two halves do not match. B for border:  The edges of the growth are irregular. C for color:  A mixture of colors are present instead of an even brown color. D for diameter:  Melanomas are usually (but not always) greater than 6mm - the size of a pencil eraser. E for evolution:  The spot keeps changing in size, shape, and color.  Please check your skin once per month between visits. You can use a small mirror in front and a large mirror behind you to keep an eye on the back side or your body.   If you see any new or changing lesions before your next follow-up, please call to schedule a visit.  Please continue daily skin protection including broad spectrum sunscreen SPF 30+ to sun-exposed areas, reapplying every 2 hours as needed when you're outdoors.   Staying in the shade or wearing long sleeves, sun glasses (UVA+UVB protection) and wide brim hats (4-inch brim around the entire circumference of the hat) are also recommended for sun protection.     Due to recent changes in healthcare laws, you may see results of  your pathology and/or laboratory studies on MyChart before the doctors have had a chance to review them. We understand that in some cases there may be results that are confusing or concerning to you. Please understand that not all results are received at the same time and often the doctors may need to interpret multiple results in order to provide you with the best plan of care or course of treatment. Therefore, we ask that you please give us  2 business days to thoroughly review all your results before contacting the office for clarification. Should we see a critical lab result, you will be contacted sooner.   If You Need Anything After Your Visit  If you have any questions or concerns for your doctor, please call our main line at [phone] and press option 4 to reach your doctor's medical assistant. If no one answers, please leave a voicemail as directed and we will return your call as soon as possible. Messages left after 4 pm will be answered the following business day.   You may also send us  a message via MyChart. We typically respond to MyChart messages within 1-2 business days.  For prescription refills, please ask your pharmacy to contact our office. Our fax number is [phone].  If you have an urgent issue when the clinic is closed that cannot wait until the next business day, you can page your doctor at the number below.  Please note that while we do our best to be available for urgent issues outside of office hours, we are not available 24/7.   If you have an urgent issue and are unable to reach us , you may choose to seek medical care at your doctor's office, retail clinic, urgent care center, or emergency room.  If you have a medical emergency, please immediately call 911 or go to the emergency department.  Pager Numbers  - Dr. Hester: [phone]  - Dr. Jackquline: [phone]  - Dr. Claudene: [phone]   - Dr. Raymund: [phone]  In the event of inclement weather,  please call our main line at [phone] for an update on the status of any delays or closures.  Dermatology Medication Tips: Please keep the boxes that topical medications come in in order to help keep track of the instructions about where and how to use these. Pharmacies typically print the medication instructions only on the boxes and not directly on the medication tubes.   If your medication is too expensive, please contact our office at [phone] option 4 or send us  a message through MyChart.   We are unable to tell what your co-pay for medications will be in advance as this is different depending on your insurance coverage. However, we may be able to find a substitute medication at lower cost or fill out paperwork to get insurance to cover a needed medication.   If a prior authorization is required to get your medication covered by your insurance company, please allow us  1-2 business days to complete this process.  Drug prices often vary depending on where the prescription is filled and some pharmacies may offer cheaper prices.  The website www.goodrx.com contains coupons for medications through different pharmacies. The prices here do not account for what the cost may be with help from insurance (it may be cheaper with your insurance), but the website can give you the price if you did not use any insurance.  - You can print the associated coupon and take it with your prescription to the pharmacy.  - You may also stop by our office during regular business hours and pick up a GoodRx coupon card.  - If you need your prescription sent electronically to a different pharmacy, notify our office through [facility] or by phone at [phone] option 4.     Si Usted Necesita Algo Despus de Su Visita  Tambin puede enviarnos un mensaje a travs de Clinical cytogeneticist. Por lo general respondemos a los mensajes de MyChart en el transcurso de 1 a 2 das hbiles.  Para renovar recetas, por favor  pida a su farmacia que se ponga en contacto con nuestra oficina. Randi lakes de fax es [place] [employer] [phone].  Si tiene un asunto urgente cuando la clnica est cerrada y que no puede esperar hasta el siguiente da hbil, puede llamar/localizar a su doctor(a) al nmero que aparece a continuacin.   Por favor, tenga en cuenta que aunque hacemos todo lo posible para estar disponibles para asuntos urgentes fuera del horario de [place], no estamos disponibles las 24 horas del da, los 7 [address]  [address] de la [place].   Si tiene un problema urgente y no puede comunicarse con nosotros, puede optar por buscar atencin mdica  en el consultorio de su doctor(a), en una clnica privada, en un centro de atencin urgente o en una sala de emergencias.  Si tiene Engineer, drilling, por favor llame inmediatamente al 911 o vaya a la sala de emergencias.  Nmeros  de bper  - Dr. Hester: [phone]  - Dra. Jackquline: [phone]  - Dr. Claudene: [phone]  - Dra. Kitts: [phone]  En caso de inclemencias del [place], por favor llame a nuestra lnea principal al [phone] para una actualizacin sobre el estado de cualquier retraso o cierre.  Consejos para la medicacin en dermatologa: Por favor, guarde las cajas en las que vienen los medicamentos de uso tpico para ayudarle a seguir las instrucciones sobre dnde y cmo usarlos. Las farmacias generalmente imprimen las instrucciones del medicamento slo en las cajas y no directamente en los tubos del [place].   Si su medicamento es muy caro, por favor, pngase en contacto con landry rieger llamando al [phone] y presione la opcin 4 o envenos un mensaje a travs de Clinical cytogeneticist.   No podemos decirle cul ser su copago por los medicamentos por adelantado ya que esto es diferente dependiendo de la cobertura de su seguro. Sin embargo, es posible que podamos encontrar un medicamento sustituto a Audiological scientist un formulario para que el seguro cubra el  medicamento que se considera necesario.   Si se requiere una autorizacin previa para que su compaa de seguros malta su medicamento, por favor permtanos de 1 a 2 das hbiles para completar este proceso.  Los precios de los medicamentos varan con frecuencia dependiendo del Environmental consultant de dnde se surte la receta y alguna farmacias pueden ofrecer precios ms baratos.  El sitio web www.goodrx.com tiene cupones para medicamentos de Health and safety inspector. Los precios aqu no tienen en cuenta lo que podra costar con la ayuda del seguro (puede ser ms barato con su seguro), pero el sitio web puede darle el precio si no utiliz Tourist information centre manager.  - Puede imprimir el cupn correspondiente y llevarlo con su receta a la farmacia.  - Tambin puede pasar por nuestra oficina durante el horario de atencin regular y Education officer, museum una tarjeta de cupones de GoodRx.  - Si necesita que su receta se enve electrnicamente a una farmacia diferente, informe a nuestra oficina a travs de MyChart de [place] o por telfono llamando al [phone] y presione la opcin 4.

## 2024-11-08 ENCOUNTER — Ambulatory Visit: Admitting: Family Medicine

## 2025-02-15 ENCOUNTER — Ambulatory Visit: Admitting: Dermatology

## 2025-05-11 ENCOUNTER — Encounter: Admitting: Family Medicine
